# Patient Record
Sex: Female | Born: 1960
Health system: Southern US, Community
[De-identification: ages and names within clinical notes are randomized; demographics above are authoritative.]

## PROBLEM LIST (undated history)

## (undated) DIAGNOSIS — M8580 Other specified disorders of bone density and structure, unspecified site: Secondary | ICD-10-CM

## (undated) DIAGNOSIS — I10 Essential (primary) hypertension: Secondary | ICD-10-CM

## (undated) DIAGNOSIS — Z8742 Personal history of other diseases of the female genital tract: Secondary | ICD-10-CM

## (undated) DIAGNOSIS — L719 Rosacea, unspecified: Secondary | ICD-10-CM

## (undated) DIAGNOSIS — C801 Malignant (primary) neoplasm, unspecified: Secondary | ICD-10-CM

## (undated) DIAGNOSIS — B001 Herpesviral vesicular dermatitis: Secondary | ICD-10-CM

## (undated) DIAGNOSIS — K140 Glossitis: Secondary | ICD-10-CM

## (undated) DIAGNOSIS — D219 Benign neoplasm of connective and other soft tissue, unspecified: Secondary | ICD-10-CM

## (undated) DIAGNOSIS — B009 Herpesviral infection, unspecified: Secondary | ICD-10-CM

## (undated) HISTORY — PX: WISDOM TOOTH EXTRACTION: SHX21

## (undated) HISTORY — DX: Personal history of other diseases of the female genital tract: Z87.42

## (undated) HISTORY — DX: Benign neoplasm of connective and other soft tissue, unspecified: D21.9

## (undated) HISTORY — DX: Essential (primary) hypertension: I10

## (undated) HISTORY — DX: Herpesviral vesicular dermatitis: B00.1

## (undated) HISTORY — DX: Rosacea, unspecified: L71.9

## (undated) HISTORY — DX: Glossitis: K14.0

## (undated) HISTORY — DX: Other specified disorders of bone density and structure, unspecified site: M85.80

## (undated) HISTORY — DX: Herpesviral infection, unspecified: B00.9

---
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
## 1979-10-05 HISTORY — PX: AUGMENTATION MAMMAPLASTY: SUR837

## 1983-10-05 HISTORY — PX: OTHER SURGICAL HISTORY: SHX169

## 2009-10-04 HISTORY — PX: COLONOSCOPY: SHX174

## 2009-10-04 LAB — HM COLONOSCOPY

## 2010-04-23 ENCOUNTER — Ambulatory Visit: Payer: Self-pay | Admitting: Unknown Physician Specialty

## 2010-05-05 ENCOUNTER — Ambulatory Visit: Payer: Self-pay | Admitting: Unknown Physician Specialty

## 2010-12-14 ENCOUNTER — Ambulatory Visit: Payer: Self-pay | Admitting: Unknown Physician Specialty

## 2011-08-20 LAB — HM PAP SMEAR

## 2012-03-29 ENCOUNTER — Ambulatory Visit: Payer: Self-pay

## 2012-10-04 DIAGNOSIS — C801 Malignant (primary) neoplasm, unspecified: Secondary | ICD-10-CM

## 2012-10-04 HISTORY — DX: Malignant (primary) neoplasm, unspecified: C80.1

## 2013-04-03 ENCOUNTER — Ambulatory Visit: Payer: Self-pay

## 2014-05-22 ENCOUNTER — Ambulatory Visit: Payer: Self-pay

## 2014-05-24 LAB — HM MAMMOGRAPHY

## 2015-01-08 ENCOUNTER — Encounter: Payer: Self-pay | Admitting: *Deleted

## 2015-06-23 ENCOUNTER — Other Ambulatory Visit: Payer: Self-pay | Admitting: Certified Nurse Midwife

## 2015-06-23 DIAGNOSIS — Z1231 Encounter for screening mammogram for malignant neoplasm of breast: Secondary | ICD-10-CM

## 2015-06-25 ENCOUNTER — Other Ambulatory Visit: Payer: Self-pay | Admitting: Certified Nurse Midwife

## 2015-06-25 ENCOUNTER — Ambulatory Visit
Admission: RE | Admit: 2015-06-25 | Discharge: 2015-06-25 | Disposition: A | Discharge status: Home / Self Care | Payer: 59 | Source: Ambulatory Visit | Attending: Certified Nurse Midwife | Admitting: Certified Nurse Midwife

## 2015-06-25 DIAGNOSIS — Z1231 Encounter for screening mammogram for malignant neoplasm of breast: Secondary | ICD-10-CM

## 2015-06-25 HISTORY — DX: Malignant (primary) neoplasm, unspecified: C80.1

## 2015-10-09 ENCOUNTER — Other Ambulatory Visit: Payer: Self-pay

## 2015-10-09 ENCOUNTER — Other Ambulatory Visit: Payer: Self-pay | Admitting: Unknown Physician Specialty

## 2015-10-09 DIAGNOSIS — I10 Essential (primary) hypertension: Secondary | ICD-10-CM | POA: Insufficient documentation

## 2015-10-09 DIAGNOSIS — C439 Malignant melanoma of skin, unspecified: Secondary | ICD-10-CM | POA: Insufficient documentation

## 2015-10-09 DIAGNOSIS — B009 Herpesviral infection, unspecified: Secondary | ICD-10-CM | POA: Insufficient documentation

## 2015-10-09 DIAGNOSIS — L719 Rosacea, unspecified: Secondary | ICD-10-CM | POA: Insufficient documentation

## 2015-10-09 DIAGNOSIS — K140 Glossitis: Secondary | ICD-10-CM | POA: Insufficient documentation

## 2015-10-09 NOTE — Telephone Encounter (Signed)
Patient needs refill on her bp medications. Patient schedule an appt for 10/28/14. Her pharmacy is [facility]. Please call pt if refill has been done, thanks.

## 2015-10-09 NOTE — Telephone Encounter (Signed)
Patient was last seen 05/24/14 but scheduled an appointment for 10/29/15. Pharmacy is [facility].

## 2015-10-10 MED ORDER — LISINOPRIL 20 MG PO TABS: 20.0000 mg | ORAL_TABLET | Freq: Every day | ORAL | Status: DC

## 2015-10-10 NOTE — Telephone Encounter (Signed)
Called patient and let her know that her prescription was sent in.

## 2015-10-10 NOTE — Telephone Encounter (Signed)
This was done.

## 2015-10-29 ENCOUNTER — Encounter: Payer: Self-pay | Admitting: Unknown Physician Specialty

## 2015-10-29 ENCOUNTER — Ambulatory Visit (INDEPENDENT_AMBULATORY_CARE_PROVIDER_SITE_OTHER): Payer: 59 | Admitting: Unknown Physician Specialty

## 2015-10-29 VITALS — BP 126/80 | HR 73 | Temp 98.2°F | Ht 64.3 in | Wt 126.2 lb

## 2015-10-29 DIAGNOSIS — B009 Herpesviral infection, unspecified: Secondary | ICD-10-CM | POA: Diagnosis not present

## 2015-10-29 DIAGNOSIS — I10 Essential (primary) hypertension: Secondary | ICD-10-CM | POA: Diagnosis not present

## 2015-10-29 DIAGNOSIS — E7800 Pure hypercholesterolemia, unspecified: Secondary | ICD-10-CM | POA: Diagnosis not present

## 2015-10-29 DIAGNOSIS — L719 Rosacea, unspecified: Secondary | ICD-10-CM | POA: Diagnosis not present

## 2015-10-29 MED ORDER — LISINOPRIL 10 MG PO TABS: 10.0000 mg | ORAL_TABLET | Freq: Every day | ORAL | Status: DC

## 2015-10-29 MED ORDER — TRETINOIN 0.1 % EX CREA: TOPICAL_CREAM | Freq: Every day | CUTANEOUS | Status: AC

## 2015-10-29 MED ORDER — VALACYCLOVIR HCL 500 MG PO TABS: 500.0000 mg | ORAL_TABLET | Freq: Two times a day (BID) | ORAL | Status: DC

## 2015-10-29 MED ORDER — CLINDAMYCIN PHOSPHATE 1 % EX SOLN: 1.0000 "application " | Freq: Two times a day (BID) | CUTANEOUS | Status: DC

## 2015-10-29 NOTE — Progress Notes (Signed)
BP 126/80 mmHg  Pulse 73  Temp(Src) 98.2 F (36.8 C)  Ht 5' 4.3" (1.633 m)  Wt 126 lb 3.2 oz (57.244 kg)  BMI 21.47 kg/m2  SpO2 100%  LMP 12/26/2013 (Approximate)   Subjective:    Patient ID: Hailey Rivera, female    DOB: 07/11/1961, 55 y.o.   MRN: [id number]  HPI: Hailey Rivera is a 55 y.o. female  Chief Complaint  Patient presents with  . Hypertension  Hypertension: Lisinopril. No issues taking medication. Takes BP at work and averages around 110/70. Denies lightheadedness, dizziness. Denies SOB or chest pain. Denies any swelling. Patient states she eats a very healthy diet and exercises 4x a week at the gym doing both cardio and toning classes.   Rosacea: Uses Retin-A as needed for breakouts and is effective.  No issues taking medication.  Herpes: Patient takes Valtrex as needed for outbreaks and likes to have on hand at all times. Medication effective and no issues taking.        Relevant past medical, surgical, family and social history reviewed and updated as indicated. Interim medical history since our last visit reviewed. Allergies and medications reviewed and updated.  Review of Systems  Constitutional: Negative.   HENT: Negative.   Eyes: Negative.   Respiratory: Negative.   Cardiovascular: Negative.   Gastrointestinal: Negative.   Endocrine: Negative.   Genitourinary: Negative.   Musculoskeletal: Negative.   Skin: Negative.   Allergic/Immunologic: Negative.   Neurological: Negative.   Hematological: Negative.   Psychiatric/Behavioral: Negative.     Per HPI unless specifically indicated above     Objective:    BP 126/80 mmHg  Pulse 73  Temp(Src) 98.2 F (36.8 C)  Ht 5' 4.3" (1.633 m)  Wt 126 lb 3.2 oz (57.244 kg)  BMI 21.47 kg/m2  SpO2 100%  LMP 12/26/2013 (Approximate)  Wt Readings from Last 3 Encounters:  10/29/15 126 lb 3.2 oz (57.244 kg)  05/24/14 127 lb (57.607 kg)    Physical Exam  Constitutional: She is oriented to person, place,  and time. She appears well-developed and well-nourished. No distress.  HENT:  Head: Normocephalic and atraumatic.  Eyes: Conjunctivae and lids are normal. Right eye exhibits no discharge. Left eye exhibits no discharge. No scleral icterus.  Neck: Normal range of motion. Neck supple. No JVD present. Carotid bruit is not present.  Cardiovascular: Normal rate, regular rhythm and normal heart sounds.   Pulmonary/Chest: Effort normal and breath sounds normal.  Abdominal: Normal appearance. There is no splenomegaly or hepatomegaly.  Musculoskeletal: Normal range of motion.  Neurological: She is alert and oriented to person, place, and time.  Skin: Skin is warm, dry and intact. No rash noted. No pallor.  Psychiatric: She has a normal mood and affect. Her behavior is normal. Judgment and thought content normal.    Results for orders placed or performed in visit on 10/09/15  HM MAMMOGRAPHY  Result Value Ref Range   HM Mammogram from PP   HM PAP SMEAR  Result Value Ref Range   HM Pap smear from PP   HM COLONOSCOPY  Result Value Ref Range   HM Colonoscopy from PP       Assessment & Plan:   Problem List Items Addressed This Visit      Unprioritized   Hypertension    Stable, continue present medications.       Relevant Medications   lisinopril (PRINIVIL,ZESTRIL) 10 MG tablet   Herpes simplex - Primary    Stable,  continue present medications.       Relevant Medications   valACYclovir (VALTREX) 500 MG tablet   Rosacea    Stable, continue present medications.       Hypercholesteremia    LDL slightly elevated at 107.  ASCVD calculator shows not in statin benefit group at this time. Encouraged to increase her exercising to 5 days a week as well as trying HIIT training.        Relevant Medications   lisinopril (PRINIVIL,ZESTRIL) 10 MG tablet       Follow up plan: Return in about 6 months (around 04/27/2016).

## 2015-10-29 NOTE — Assessment & Plan Note (Signed)
Stable, continue present medications.   

## 2015-10-29 NOTE — Assessment & Plan Note (Addendum)
Stable, continue present medications.   

## 2015-10-29 NOTE — Assessment & Plan Note (Signed)
LDL slightly elevated at 107.  ASCVD calculator shows not in statin benefit group at this time. Encouraged to increase her exercising to 5 days a week as well as trying HIIT training.

## 2015-12-19 DIAGNOSIS — L814 Other melanin hyperpigmentation: Secondary | ICD-10-CM | POA: Diagnosis not present

## 2015-12-19 DIAGNOSIS — D225 Melanocytic nevi of trunk: Secondary | ICD-10-CM | POA: Diagnosis not present

## 2015-12-19 DIAGNOSIS — L853 Xerosis cutis: Secondary | ICD-10-CM | POA: Diagnosis not present

## 2015-12-19 DIAGNOSIS — D229 Melanocytic nevi, unspecified: Secondary | ICD-10-CM | POA: Diagnosis not present

## 2015-12-19 DIAGNOSIS — Z85820 Personal history of malignant melanoma of skin: Secondary | ICD-10-CM | POA: Diagnosis not present

## 2016-03-02 ENCOUNTER — Encounter: Payer: Self-pay | Admitting: Physician Assistant

## 2016-03-02 ENCOUNTER — Ambulatory Visit: Payer: Self-pay | Admitting: Physician Assistant

## 2016-03-02 VITALS — BP 118/82 | HR 60 | Temp 98.5°F

## 2016-03-02 DIAGNOSIS — R300 Dysuria: Secondary | ICD-10-CM

## 2016-03-02 DIAGNOSIS — N390 Urinary tract infection, site not specified: Secondary | ICD-10-CM

## 2016-03-02 LAB — POCT URINALYSIS DIPSTICK
Bilirubin, UA: NEGATIVE
Blood, UA: NEGATIVE
Glucose, UA: NEGATIVE
Ketones, UA: NEGATIVE
Nitrite, UA: NEGATIVE
Protein, UA: NEGATIVE
Spec Grav, UA: 1.02
Urobilinogen, UA: 0.2
pH, UA: 6

## 2016-03-02 MED ORDER — CIPROFLOXACIN HCL 500 MG PO TABS: 500.0000 mg | ORAL_TABLET | Freq: Two times a day (BID) | ORAL | Status: DC

## 2016-03-02 NOTE — Progress Notes (Signed)
S:  C/o uti sx for 2 days, burning, urgency, frequency, denies vaginal discharge, abdominal pain or flank pain:  Remainder ros neg  O:  Vitals wnl, nad, no cva tenderness, back nontender, lungs c t a,cv rrr, abd soft nontender, bs normal, n/v intact  A: uti  P: cipro 500 mg bid x 3d, increase water intake, add cranberry juice, return if not improving in 2 -3 days, return earlier if worsening, discussed pyelonephritis sx

## 2016-03-31 DIAGNOSIS — H5213 Myopia, bilateral: Secondary | ICD-10-CM | POA: Diagnosis not present

## 2016-06-25 DIAGNOSIS — D225 Melanocytic nevi of trunk: Secondary | ICD-10-CM | POA: Diagnosis not present

## 2016-06-25 DIAGNOSIS — L814 Other melanin hyperpigmentation: Secondary | ICD-10-CM | POA: Diagnosis not present

## 2016-06-25 DIAGNOSIS — D2362 Other benign neoplasm of skin of left upper limb, including shoulder: Secondary | ICD-10-CM | POA: Diagnosis not present

## 2016-06-25 DIAGNOSIS — D1801 Hemangioma of skin and subcutaneous tissue: Secondary | ICD-10-CM | POA: Diagnosis not present

## 2016-06-25 DIAGNOSIS — D485 Neoplasm of uncertain behavior of skin: Secondary | ICD-10-CM | POA: Diagnosis not present

## 2016-06-25 DIAGNOSIS — L821 Other seborrheic keratosis: Secondary | ICD-10-CM | POA: Diagnosis not present

## 2016-06-25 DIAGNOSIS — L570 Actinic keratosis: Secondary | ICD-10-CM | POA: Diagnosis not present

## 2016-06-25 DIAGNOSIS — L988 Other specified disorders of the skin and subcutaneous tissue: Secondary | ICD-10-CM | POA: Diagnosis not present

## 2016-06-25 DIAGNOSIS — Z85820 Personal history of malignant melanoma of skin: Secondary | ICD-10-CM | POA: Diagnosis not present

## 2016-07-05 ENCOUNTER — Encounter: Payer: Self-pay | Admitting: Physician Assistant

## 2016-07-05 ENCOUNTER — Ambulatory Visit: Payer: Self-pay | Admitting: Physician Assistant

## 2016-07-05 VITALS — BP 120/80 | HR 80 | Temp 98.8°F

## 2016-07-05 DIAGNOSIS — R300 Dysuria: Secondary | ICD-10-CM

## 2016-07-05 DIAGNOSIS — N390 Urinary tract infection, site not specified: Secondary | ICD-10-CM

## 2016-07-05 LAB — POCT URINALYSIS DIPSTICK
BILIRUBIN UA: NEGATIVE
Glucose, UA: NEGATIVE
KETONES UA: NEGATIVE
Nitrite, UA: NEGATIVE
Protein, UA: NEGATIVE
RBC UA: NEGATIVE
SPEC GRAV UA: 1.02
Urobilinogen, UA: 0.2
pH, UA: 6

## 2016-07-05 MED ORDER — CIPROFLOXACIN HCL 250 MG PO TABS: 250.0000 mg | ORAL_TABLET | Freq: Two times a day (BID) | ORAL | 0 refills | Status: DC

## 2016-07-05 MED ORDER — PHENAZOPYRIDINE HCL 200 MG PO TABS: 200.0000 mg | ORAL_TABLET | Freq: Three times a day (TID) | ORAL | 0 refills | Status: DC | PRN

## 2016-07-05 NOTE — Progress Notes (Signed)
S:  C/o uti sx for 3 days, burning, urgency, frequency, denies vaginal discharge, abdominal pain or flank pain:  Remainder ros neg; took some of her mother's macrobid  O:  Vitals wnl, nad, no cva tenderness, back nontender, ua 1+ leuks  A: uti  P: cipro 250mg  bid x 7d, pyridium 200mg , increase water intake, add cranberry juice, return if not improving in 2 -3 days, return earlier if worsening, discussed pyelonephritis sx

## 2016-07-07 ENCOUNTER — Other Ambulatory Visit: Payer: Self-pay | Admitting: Certified Nurse Midwife

## 2016-07-16 DIAGNOSIS — D485 Neoplasm of uncertain behavior of skin: Secondary | ICD-10-CM | POA: Diagnosis not present

## 2016-07-16 DIAGNOSIS — L988 Other specified disorders of the skin and subcutaneous tissue: Secondary | ICD-10-CM | POA: Diagnosis not present

## 2016-07-16 DIAGNOSIS — D492 Neoplasm of unspecified behavior of bone, soft tissue, and skin: Secondary | ICD-10-CM | POA: Diagnosis not present

## 2016-07-28 DIAGNOSIS — Z124 Encounter for screening for malignant neoplasm of cervix: Secondary | ICD-10-CM | POA: Diagnosis not present

## 2016-07-28 DIAGNOSIS — Z01419 Encounter for gynecological examination (general) (routine) without abnormal findings: Secondary | ICD-10-CM | POA: Diagnosis not present

## 2016-07-30 ENCOUNTER — Other Ambulatory Visit: Payer: Self-pay | Admitting: Certified Nurse Midwife

## 2016-07-30 DIAGNOSIS — Z13820 Encounter for screening for osteoporosis: Secondary | ICD-10-CM

## 2016-07-30 DIAGNOSIS — Z8262 Family history of osteoporosis: Secondary | ICD-10-CM

## 2016-08-03 ENCOUNTER — Ambulatory Visit
Admission: RE | Admit: 2016-08-03 | Discharge: 2016-08-03 | Disposition: A | Discharge status: Home / Self Care | Payer: 59 | Source: Ambulatory Visit | Attending: Certified Nurse Midwife | Admitting: Certified Nurse Midwife

## 2016-08-03 DIAGNOSIS — M810 Age-related osteoporosis without current pathological fracture: Secondary | ICD-10-CM | POA: Insufficient documentation

## 2016-08-03 DIAGNOSIS — Z8262 Family history of osteoporosis: Secondary | ICD-10-CM | POA: Insufficient documentation

## 2016-08-03 DIAGNOSIS — Z13820 Encounter for screening for osteoporosis: Secondary | ICD-10-CM | POA: Insufficient documentation

## 2016-08-03 DIAGNOSIS — M818 Other osteoporosis without current pathological fracture: Secondary | ICD-10-CM | POA: Diagnosis not present

## 2016-08-09 ENCOUNTER — Other Ambulatory Visit: Payer: Self-pay | Admitting: Certified Nurse Midwife

## 2016-08-09 DIAGNOSIS — Z1231 Encounter for screening mammogram for malignant neoplasm of breast: Secondary | ICD-10-CM

## 2016-09-10 ENCOUNTER — Ambulatory Visit
Admission: RE | Admit: 2016-09-10 | Discharge: 2016-09-10 | Disposition: A | Discharge status: Home / Self Care | Payer: 59 | Source: Ambulatory Visit | Attending: Certified Nurse Midwife | Admitting: Certified Nurse Midwife

## 2016-09-10 DIAGNOSIS — Z1231 Encounter for screening mammogram for malignant neoplasm of breast: Secondary | ICD-10-CM | POA: Diagnosis not present

## 2016-11-17 ENCOUNTER — Other Ambulatory Visit: Payer: Self-pay | Admitting: Unknown Physician Specialty

## 2016-12-24 DIAGNOSIS — D2362 Other benign neoplasm of skin of left upper limb, including shoulder: Secondary | ICD-10-CM | POA: Diagnosis not present

## 2016-12-24 DIAGNOSIS — Z85820 Personal history of malignant melanoma of skin: Secondary | ICD-10-CM | POA: Diagnosis not present

## 2016-12-24 DIAGNOSIS — L281 Prurigo nodularis: Secondary | ICD-10-CM | POA: Diagnosis not present

## 2016-12-24 DIAGNOSIS — D229 Melanocytic nevi, unspecified: Secondary | ICD-10-CM | POA: Diagnosis not present

## 2017-01-04 DIAGNOSIS — Z0120 Encounter for dental examination and cleaning without abnormal findings: Secondary | ICD-10-CM | POA: Diagnosis not present

## 2017-06-01 DIAGNOSIS — L578 Other skin changes due to chronic exposure to nonionizing radiation: Secondary | ICD-10-CM | POA: Diagnosis not present

## 2017-06-01 DIAGNOSIS — L814 Other melanin hyperpigmentation: Secondary | ICD-10-CM | POA: Diagnosis not present

## 2017-06-01 DIAGNOSIS — Z85820 Personal history of malignant melanoma of skin: Secondary | ICD-10-CM | POA: Diagnosis not present

## 2017-06-01 DIAGNOSIS — D485 Neoplasm of uncertain behavior of skin: Secondary | ICD-10-CM | POA: Diagnosis not present

## 2017-06-22 DIAGNOSIS — D229 Melanocytic nevi, unspecified: Secondary | ICD-10-CM | POA: Diagnosis not present

## 2017-06-22 DIAGNOSIS — D485 Neoplasm of uncertain behavior of skin: Secondary | ICD-10-CM | POA: Diagnosis not present

## 2017-08-10 ENCOUNTER — Encounter: Payer: Self-pay | Admitting: Certified Nurse Midwife

## 2017-08-10 ENCOUNTER — Ambulatory Visit (INDEPENDENT_AMBULATORY_CARE_PROVIDER_SITE_OTHER): Payer: 59 | Admitting: Certified Nurse Midwife

## 2017-08-10 VITALS — BP 100/60 | HR 62 | Ht 64.5 in | Wt 126.0 lb

## 2017-08-10 DIAGNOSIS — Z1231 Encounter for screening mammogram for malignant neoplasm of breast: Secondary | ICD-10-CM | POA: Diagnosis not present

## 2017-08-10 DIAGNOSIS — Z01419 Encounter for gynecological examination (general) (routine) without abnormal findings: Secondary | ICD-10-CM | POA: Diagnosis not present

## 2017-08-10 DIAGNOSIS — M85851 Other specified disorders of bone density and structure, right thigh: Secondary | ICD-10-CM

## 2017-08-10 DIAGNOSIS — Z1321 Encounter for screening for nutritional disorder: Secondary | ICD-10-CM | POA: Diagnosis not present

## 2017-08-10 DIAGNOSIS — Z131 Encounter for screening for diabetes mellitus: Secondary | ICD-10-CM

## 2017-08-10 DIAGNOSIS — Z13220 Encounter for screening for lipoid disorders: Secondary | ICD-10-CM

## 2017-08-10 DIAGNOSIS — I10 Essential (primary) hypertension: Secondary | ICD-10-CM

## 2017-08-10 DIAGNOSIS — Z124 Encounter for screening for malignant neoplasm of cervix: Secondary | ICD-10-CM

## 2017-08-10 DIAGNOSIS — M818 Other osteoporosis without current pathological fracture: Secondary | ICD-10-CM

## 2017-08-10 DIAGNOSIS — M810 Age-related osteoporosis without current pathological fracture: Secondary | ICD-10-CM

## 2017-08-10 DIAGNOSIS — Z1239 Encounter for other screening for malignant neoplasm of breast: Secondary | ICD-10-CM

## 2017-08-10 MED ORDER — VALACYCLOVIR HCL 1 G PO TABS: ORAL_TABLET | ORAL | 5 refills | Status: DC

## 2017-08-10 NOTE — Progress Notes (Signed)
Gynecology Annual Exam  PCP: Kathrine Haddock, NP  Chief Complaint:  Chief Complaint  Patient presents with  . Gynecologic Exam    History of Present Illness:Hailey Rivera presents today for her annual exam. She is a 56 year old Caucasian/White female , G 2 P 2 0 0 2 , whose LMP was 12/2013. She is having problems with vaginal dryness during intercourse. Does use lubricants. Tried estrogen vaginal cream, but stopped using as was not having frequent IC. Her menses are absent and she is postmenopausal. She does also have occasional hot flashes and night sweats.  She has not had any spotting.   The patient's past medical history is notable for a history of melanoma, hypertension, fibroids, and a remote hx of cervical dysplasia treated with a CKC. She has also had a bilateral breast augmentation..  Since her last annual GYN exam dated 07/28/2016, she has had a DEXA scan which revealed osteopenia of the femur and osteoporosis of the spine. Frax scores were 8.1% and 1.3%. She also had another atypical lesion removed from her inner left thigh   Her most recent pap smear was obtained 07/28/2016 and was normal.  Her most recent mammogram obtained on 09/20/2016  was negative. There was no mention of some prominent folds of the implants and a question of implant rupture as there was on a previous mamogram. There is no family history of breast cancer. There is a family history of ovarian cancer in her maternal great grandmother. Genetic testing has not been done. The patient does do monthly self breast exams.  She had a colonoscopy in 2011 that showed hemorrhoids. Her next colonoscopy is due in 10 years.  She had a recent DEXA scan. See notation above The patient does not smoke.  The patient does drink occasionally.  The patient does not use illegal drugs.  The patient exercises regularly. Goes to the gym 4x/week  The patient does not get adequate calcium in her diet and she takes a calcium  supplement. She also takes a vitamin D3 supplement She has not had a recent cholesterol screen and would like screening labs.   The patient denies current symptoms of depression.    Review of Systems: Review of Systems  Constitutional: Negative for chills, fever and weight loss.  HENT: Negative for congestion, sinus pain and sore throat.   Eyes: Negative for blurred vision and pain.  Respiratory: Negative for hemoptysis, shortness of breath and wheezing.   Cardiovascular: Negative for chest pain, palpitations and leg swelling.  Gastrointestinal: Negative for abdominal pain, blood in stool, diarrhea, heartburn, nausea and vomiting.  Genitourinary: Negative for dysuria, frequency, hematuria and urgency.  Musculoskeletal: Negative for back pain, joint pain and myalgias.  Skin: Negative for itching and rash.  Neurological: Negative for dizziness, tingling and headaches.  Endo/Heme/Allergies: Negative for environmental allergies and polydipsia. Does not bruise/bleed easily.       Negative for hirsutism   Psychiatric/Behavioral: Negative for depression. The patient is not nervous/anxious and does not have insomnia.     Past Medical History:  Past Medical History:  Diagnosis Date  . Cancer ([place]) 10/04/2012   melanoma  . Fibroids   . Glossitis   . Herpes simplex   . History of abnormal cervical Pap smear    cone biopsy 1985  . Hypertension   . Osteopenia   . Rosacea     Past Surgical History:  Past Surgical History:  Procedure Laterality Date  . AUGMENTATION MAMMAPLASTY Bilateral  [place]    . COLONOSCOPY  2011   internal hemorrhoids  . TONSILLECTOMY  1971  . WISDOM TOOTH EXTRACTION     age 63    Family History:  Family History  Problem Relation Age of Onset  . Multiple myeloma Father 43  . Heart disease Father   . Hypertension Father   . Cancer Father   . Hypertension Mother   . Hyperlipidemia Mother   . Osteoporosis Mother   . Hypertension Brother     . Diabetes Maternal Grandmother   . Alcohol abuse Maternal Grandfather   . Hypertension Paternal Grandmother   . Heart disease Paternal Grandmother   . Heart disease Paternal Grandfather        MI  . Stroke Paternal Grandfather   . GI problems Brother   . Diabetes Maternal Aunt   . Ovarian cancer Other 65  . Uterine cancer Other   . Pancreatic cancer Other 33    Social History:  Social History   Socioeconomic History  . Marital status: Married    Spouse name: Not on file  . Number of children: Not on file  . Years of education: Not on file  . Highest education level: Not on file  Social Needs  . Financial resource strain: Not on file  . Food insecurity - worry: Not on file  . Food insecurity - inability: Not on file  . Transportation needs - medical: Not on file  . Transportation needs - non-medical: Not on file  Occupational History  . Not on file  Tobacco Use  . Smoking status: Never Smoker  . Smokeless tobacco: Never Used  Substance and Sexual Activity  . Alcohol use: Yes    Alcohol/week: 0.0 oz    Comment: on occasion  . Drug use: No  . Sexual activity: Yes    Birth control/protection: Post-menopausal  Other Topics Concern  . Not on file  Social History Narrative  . Not on file    Allergies:  No Known Allergies  Medications:  Current Outpatient Medications on File Prior to Visit  Medication Sig Dispense Refill  . lisinopril (PRINIVIL,ZESTRIL) 10 MG tablet TAKE 1 TABLET BY MOUTH DAILY (Patient taking differently: TAKE 1/2 TABLET BY MOUTH DAILY) 90 tablet 12  . tretinoin (RETIN-A) 0.1 % cream Apply topically at bedtime. 45 g 3   No current facility-administered medications on file prior to visit.   Vitamin D3 5000 IU several times a week Calcium 500-600 gm daily   Physical Exam Vitals:BP 100/60   Pulse 62   Ht 5' 4.5" (1.638 m)   Wt 126 lb (57.2 kg)   LMP 12/26/2013 (Approximate)   BMI 21.29 kg/m   General:WF in NAD HEENT: normocephalic,  anicteric Neck: no thyroid enlargement, no palpable nodules, no cervical lymphadenopathy  Pulmonary: No increased work of breathing, CTAB Cardiovascular: RRR, without murmur  Breast: Breast symmetrical, no tenderness, implants present, no palpable nodules or masses, no skin or nipple retraction present, no nipple discharge.  No axillary, infraclavicular or supraclavicular lymphadenopathy. Abdomen: Soft, non-tender, non-distended.  Umbilicus without lesions.  No hepatomegaly or masses palpable. No evidence of hernia. Genitourinary:  External: Atrophic changes. Small red abrasion to the right of the vaginal openin  Normal urethral meatus, normal Bartholin's and Skene's glands.    Vagina: Flattened rugae, no evidence of prolapse.    Cervix: Grossly normal in appearance, no bleeding, non-tender  Uterus: Anteverted, irregularity on the right fundal area, mobile, and non-tender  Adnexa:  No adnexal masses, non-tender  Rectal: deferred  Lymphatic: no evidence of inguinal lymphadenopathy Extremities: no edema, erythema, or tenderness Neurologic: Grossly intact Psychiatric: mood appropriate, affect full     Assessment: 56 y.o. annual gyn exam Osteoporosis of spine and osteopenia of femur  Plan:    1) Breast cancer screening - recommend monthly self breast exam and annual screening mammograms. Mammogram was ordered today. Patient to call for appointment at [facility]  2) DIscussed calcium and vitamin D3 requirements as well as exercise to combat osteoporosis.Repeat Dexa scan next year  3) Cervical cancer screening - Pap was done.   4) Colon cancer screening UTD-due 2021  5) Routine healthcare maintenance including cholesterol and diabetes screening ordered today   6) RTO in 1 year and prn  Dalia Heading, CNM

## 2017-08-11 LAB — COMPREHENSIVE METABOLIC PANEL
A/G RATIO: 2 (ref 1.2–2.2)
ALK PHOS: 114 IU/L (ref 39–117)
ALT: 15 IU/L (ref 0–32)
AST: 23 IU/L (ref 0–40)
Albumin: 4.4 g/dL (ref 3.5–5.5)
BUN/Creatinine Ratio: 16 (ref 9–23)
BUN: 11 mg/dL (ref 6–24)
Bilirubin Total: 0.3 mg/dL (ref 0.0–1.2)
CALCIUM: 10 mg/dL (ref 8.7–10.2)
CHLORIDE: 104 mmol/L (ref 96–106)
CO2: 26 mmol/L (ref 20–29)
Creatinine, Ser: 0.7 mg/dL (ref 0.57–1.00)
GFR calc Af Amer: 112 mL/min/{1.73_m2} (ref >59)
GFR calc non Af Amer: 97 mL/min/{1.73_m2} (ref >59)
GLOBULIN, TOTAL: 2.2 g/dL (ref 1.5–4.5)
Glucose: 81 mg/dL (ref 65–99)
POTASSIUM: 4.5 mmol/L (ref 3.5–5.2)
SODIUM: 142 mmol/L (ref 134–144)
Total Protein: 6.6 g/dL (ref 6.0–8.5)

## 2017-08-11 LAB — LIPID PANEL WITH LDL/HDL RATIO
CHOLESTEROL TOTAL: 193 mg/dL (ref 100–199)
HDL: 67 mg/dL (ref >39)
LDL CALC: 115 mg/dL — AB (ref 0–99)
LDl/HDL Ratio: 1.7 ratio (ref 0.0–3.2)
TRIGLYCERIDES: 55 mg/dL (ref 0–149)
VLDL CHOLESTEROL CAL: 11 mg/dL (ref 5–40)

## 2017-08-11 LAB — VITAMIN D 25 HYDROXY (VIT D DEFICIENCY, FRACTURES): Vit D, 25-Hydroxy: 59.3 ng/mL (ref 30.0–100.0)

## 2017-08-11 LAB — HGB A1C W/O EAG: HEMOGLOBIN A1C: 5.4 % (ref 4.8–5.6)

## 2017-08-12 LAB — IGP, APTIMA HPV
HPV APTIMA: NEGATIVE
PAP Smear Comment: 0

## 2017-08-19 ENCOUNTER — Encounter: Payer: Self-pay | Admitting: Certified Nurse Midwife

## 2017-08-27 ENCOUNTER — Encounter: Payer: Self-pay | Admitting: Certified Nurse Midwife

## 2017-08-27 DIAGNOSIS — M810 Age-related osteoporosis without current pathological fracture: Secondary | ICD-10-CM | POA: Insufficient documentation

## 2017-08-27 DIAGNOSIS — M818 Other osteoporosis without current pathological fracture: Secondary | ICD-10-CM

## 2017-08-27 DIAGNOSIS — M85859 Other specified disorders of bone density and structure, unspecified thigh: Secondary | ICD-10-CM | POA: Insufficient documentation

## 2017-09-22 ENCOUNTER — Ambulatory Visit
Admission: RE | Admit: 2017-09-22 | Discharge: 2017-09-22 | Disposition: A | Discharge status: Home / Self Care | Payer: 59 | Source: Ambulatory Visit | Attending: Certified Nurse Midwife | Admitting: Certified Nurse Midwife

## 2017-09-22 DIAGNOSIS — Z1239 Encounter for other screening for malignant neoplasm of breast: Secondary | ICD-10-CM

## 2017-09-22 DIAGNOSIS — Z1231 Encounter for screening mammogram for malignant neoplasm of breast: Secondary | ICD-10-CM | POA: Insufficient documentation

## 2017-11-24 ENCOUNTER — Other Ambulatory Visit: Payer: Self-pay | Admitting: Unknown Physician Specialty

## 2017-11-29 ENCOUNTER — Encounter (INDEPENDENT_AMBULATORY_CARE_PROVIDER_SITE_OTHER): Payer: Self-pay

## 2017-11-29 ENCOUNTER — Other Ambulatory Visit: Payer: Self-pay | Admitting: Certified Nurse Midwife

## 2017-11-29 MED ORDER — LISINOPRIL 10 MG PO TABS: 5.0000 mg | ORAL_TABLET | Freq: Every day | ORAL | 0 refills | Status: DC

## 2017-11-29 NOTE — Telephone Encounter (Signed)
Pt called nurse line, only gave DOB and # to call [phone].

## 2017-12-01 DIAGNOSIS — L578 Other skin changes due to chronic exposure to nonionizing radiation: Secondary | ICD-10-CM | POA: Diagnosis not present

## 2017-12-01 DIAGNOSIS — D1800 Hemangioma unspecified site: Secondary | ICD-10-CM | POA: Diagnosis not present

## 2017-12-01 DIAGNOSIS — L720 Epidermal cyst: Secondary | ICD-10-CM | POA: Diagnosis not present

## 2017-12-01 DIAGNOSIS — Z86018 Personal history of other benign neoplasm: Secondary | ICD-10-CM | POA: Diagnosis not present

## 2017-12-01 DIAGNOSIS — L281 Prurigo nodularis: Secondary | ICD-10-CM | POA: Diagnosis not present

## 2017-12-01 DIAGNOSIS — L0109 Other impetigo: Secondary | ICD-10-CM | POA: Diagnosis not present

## 2017-12-01 DIAGNOSIS — D485 Neoplasm of uncertain behavior of skin: Secondary | ICD-10-CM | POA: Diagnosis not present

## 2018-05-23 DIAGNOSIS — H5213 Myopia, bilateral: Secondary | ICD-10-CM | POA: Diagnosis not present

## 2018-06-23 DIAGNOSIS — L281 Prurigo nodularis: Secondary | ICD-10-CM | POA: Diagnosis not present

## 2018-06-23 DIAGNOSIS — L821 Other seborrheic keratosis: Secondary | ICD-10-CM | POA: Diagnosis not present

## 2018-06-23 DIAGNOSIS — Z85820 Personal history of malignant melanoma of skin: Secondary | ICD-10-CM | POA: Diagnosis not present

## 2018-06-23 DIAGNOSIS — Z859 Personal history of malignant neoplasm, unspecified: Secondary | ICD-10-CM | POA: Diagnosis not present

## 2018-06-23 DIAGNOSIS — Z86018 Personal history of other benign neoplasm: Secondary | ICD-10-CM | POA: Diagnosis not present

## 2018-06-23 DIAGNOSIS — Z1283 Encounter for screening for malignant neoplasm of skin: Secondary | ICD-10-CM | POA: Diagnosis not present

## 2018-06-23 DIAGNOSIS — L578 Other skin changes due to chronic exposure to nonionizing radiation: Secondary | ICD-10-CM | POA: Diagnosis not present

## 2018-09-06 ENCOUNTER — Ambulatory Visit: Payer: 59 | Admitting: Certified Nurse Midwife

## 2018-09-18 ENCOUNTER — Other Ambulatory Visit (HOSPITAL_COMMUNITY)
Admission: RE | Admit: 2018-09-18 | Discharge: 2018-09-18 | Disposition: A | Discharge status: Home / Self Care | Payer: 59 | Source: Ambulatory Visit | Attending: Certified Nurse Midwife | Admitting: Certified Nurse Midwife

## 2018-09-18 ENCOUNTER — Ambulatory Visit (INDEPENDENT_AMBULATORY_CARE_PROVIDER_SITE_OTHER): Payer: 59 | Admitting: Certified Nurse Midwife

## 2018-09-18 ENCOUNTER — Encounter: Payer: Self-pay | Admitting: Certified Nurse Midwife

## 2018-09-18 ENCOUNTER — Ambulatory Visit
Admission: RE | Admit: 2018-09-18 | Discharge: 2018-09-18 | Disposition: A | Discharge status: Home / Self Care | Payer: 59 | Source: Ambulatory Visit | Attending: Certified Nurse Midwife | Admitting: Certified Nurse Midwife

## 2018-09-18 VITALS — BP 112/60 | HR 72 | Ht 64.5 in | Wt 123.0 lb

## 2018-09-18 DIAGNOSIS — Z01419 Encounter for gynecological examination (general) (routine) without abnormal findings: Secondary | ICD-10-CM | POA: Diagnosis not present

## 2018-09-18 DIAGNOSIS — Z1239 Encounter for other screening for malignant neoplasm of breast: Secondary | ICD-10-CM

## 2018-09-18 DIAGNOSIS — Z124 Encounter for screening for malignant neoplasm of cervix: Secondary | ICD-10-CM | POA: Diagnosis not present

## 2018-09-18 DIAGNOSIS — Z780 Asymptomatic menopausal state: Secondary | ICD-10-CM | POA: Diagnosis not present

## 2018-09-18 DIAGNOSIS — M85851 Other specified disorders of bone density and structure, right thigh: Secondary | ICD-10-CM | POA: Insufficient documentation

## 2018-09-18 DIAGNOSIS — M818 Other osteoporosis without current pathological fracture: Secondary | ICD-10-CM | POA: Insufficient documentation

## 2018-09-18 DIAGNOSIS — M810 Age-related osteoporosis without current pathological fracture: Secondary | ICD-10-CM

## 2018-09-18 MED ORDER — VALACYCLOVIR HCL 1 G PO TABS: ORAL_TABLET | ORAL | 5 refills | Status: DC

## 2018-09-18 NOTE — Progress Notes (Signed)
Gynecology Annual Exam  PCP: Kathrine Haddock, NP  Chief Complaint:  Chief Complaint  Patient presents with  . Gynecologic Exam    Refill on Valtrex    History of Present Illness:Hailey Rivera presents today for her annual exam and for a refill of Valtrex for cold sores. She is a 57 year old Caucasian/White female , G 2 P 2 0 0 2 , whose LMP was 12/2013. She is having problems with vaginal dryness during intercourse. Does use lubricants. Tried estrogen vaginal cream, but stopped using as was not having frequent IC. Her menses are absent and she is postmenopausal. She denies hot flashes. She has not had any spotting.   The patient's past medical history is notable for a history of melanoma, hypertension, fibroids, osteoporosis of the spine and osteopenia of the femur, and a remote hx of cervical dysplasia treated with a CKC. She has also had a bilateral breast augmentation. Since her last annual GYN exam dated 08/10/2017, she has not had any significant changes in her health. Her dermatologist is now seeing her every year for follow up on her melanoma since she has had no recurrence.   A DEXA scan on 08/03/2016 revealed osteopenia of the femur (-2.2 T score) and osteoporosis (-2.6 T score) of the spine. Frax scores were 8.1% and 1.3%.   Her most recent pap smear was obtained 08/10/2017 and was NIL/neg HRHPV.  Her most recent mammogram obtained on 09/22/2017  was negative. There is no family history of breast cancer. There is a family history of ovarian cancer in her maternal great grandmother. Genetic testing has not been done. The patient does do monthly self breast exams.  She had a colonoscopy in 2011 that showed hemorrhoids. Her next colonoscopy is due in 10 years.   The patient does not smoke.  The patient does drink occasionally.  The patient does not use illegal drugs.  The patient exercises regularly. Goes to the gym 4x/week  The patient does not get adequate calcium in her  diet and she takes a calcium supplement. She also takes a vitamin D3 supplement She has had a recent cholesterol and diabetes screen in 2018 and they were normal. Vitamin D3 level was also normal.   The patient denies current symptoms of depression.    Review of Systems: Review of Systems  Constitutional: Negative for chills, fever and weight loss.  HENT: Negative for congestion, sinus pain and sore throat.   Eyes: Negative for blurred vision and pain.  Respiratory: Negative for hemoptysis, shortness of breath and wheezing.   Cardiovascular: Negative for chest pain, palpitations and leg swelling.  Gastrointestinal: Negative for abdominal pain, blood in stool, diarrhea, heartburn, nausea and vomiting.  Genitourinary: Negative for dysuria, frequency, hematuria and urgency.       Positive for vaginal dryness  Musculoskeletal: Negative for back pain, joint pain and myalgias.  Skin: Negative for itching and rash.  Neurological: Negative for dizziness, tingling and headaches.  Endo/Heme/Allergies: Positive for environmental allergies. Negative for polydipsia. Does not bruise/bleed easily.       Negative for hirsutism   Psychiatric/Behavioral: Negative for depression. The patient is not nervous/anxious and does not have insomnia.     Past Medical History:  Past Medical History:  Diagnosis Date  . Cancer ([place]) 10/04/2012   melanoma  . Fibroids   . Glossitis   . Herpes simplex   . History of abnormal cervical Pap smear    cone biopsy 1985  .  Hypertension   . Osteopenia   . Rosacea     Past Surgical History:  Past Surgical History:  Procedure Laterality Date  . AUGMENTATION MAMMAPLASTY Bilateral 1981  . [place]  . CKC  1985   CKC of cervix  . COLONOSCOPY  2011   internal hemorrhoids  . TONSILLECTOMY  1971  . WISDOM TOOTH EXTRACTION  age 89   age 17    Family History:  Family History  Problem Relation Age of Onset  . Multiple myeloma Father 63  . Heart  disease Father   . Hypertension Father   . Cancer Father   . Hypertension Mother   . Hyperlipidemia Mother   . Osteoporosis Mother   . Hypertension Brother   . Diabetes Maternal Grandmother   . Alcohol abuse Maternal Grandfather   . Hypertension Paternal Grandmother   . Heart disease Paternal Grandmother   . Heart disease Paternal Grandfather        MI  . GI problems Brother   . Diabetes Maternal Aunt   . Ovarian cancer Other 60       ovarian vs uterine cancer  . Diabetes Other   . Pancreatic cancer Other 60  . Heart disease Paternal Uncle     Social History:  Social History   Socioeconomic History  . Marital status: Married    Spouse name: Not on file  . Number of children: 2  . Years of education: 16  . Highest education level: Bachelor's degree (e.g., BA, AB, BS)  Occupational History  . Occupation: Therapist, sports  Social Needs  . Financial resource strain: Not on file  . Food insecurity:    Worry: Not on file    Inability: Not on file  . Transportation needs:    Medical: Not on file    Non-medical: Not on file  Tobacco Use  . Smoking status: Never Smoker  . Smokeless tobacco: Never Used  Substance and Sexual Activity  . Alcohol use: Yes    Alcohol/week: 0.0 standard drinks    Comment: on occasion  . Drug use: No  . Sexual activity: Yes    Partners: Male    Birth control/protection: Post-menopausal  Lifestyle  . Physical activity:    Days per week: 4 days    Minutes per session: 60 min  . Stress: Not at all  Relationships  . Social connections:    Talks on phone: More than three times a week    Gets together: Twice a week    Attends religious service: More than 4 times per year    Active member of club or organization: Yes    Attends meetings of clubs or organizations: More than 4 times per year    Relationship status: Married  . Intimate partner violence:    Fear of current or ex partner: No    Emotionally abused: No    Physically abused: No    Forced  sexual activity: No  Other Topics Concern  . Not on file  Social History Narrative  . Not on file    Allergies:  No Known Allergies  Medications:  Current Outpatient Medications on File Prior to Visit  Medication Sig Dispense Refill  . tretinoin (RETIN-A) 0.1 % cream Apply topically at bedtime. 45 g 3  . valACYclovir (VALTREX) 1000 MG tablet Take 2000 mgm q12hours x2 doses prn cold sores 30 tablet 5   No current facility-administered medications on file prior to visit.   Vitamin D3 5000  IU several times a week Calcium 500-600 gm daily   Physical Exam Vitals:BP 112/60 (BP Location: Left Arm, Patient Position: Sitting, Cuff Size: Normal)   Pulse 72   Ht 5' 4.5" (1.638 m)   Wt 123 lb (55.8 kg)   LMP 12/26/2013 (Approximate)   BMI 20.79 kg/m   General:WF in NAD HEENT: normocephalic, anicteric. Has cold sores on upper lip Neck: no thyroid enlargement, no palpable nodules, no cervical lymphadenopathy  Pulmonary: No increased work of breathing, CTAB Cardiovascular: RRR, without murmur  Breast: Breast symmetrical, no tenderness, implants present, no palpable nodules or masses, no skin or nipple retraction present, no nipple discharge.  No axillary, infraclavicular or supraclavicular lymphadenopathy. Abdomen: Soft, non-tender, non-distended.  Umbilicus without lesions.  No hepatomegaly or masses palpable. No evidence of hernia. Genitourinary:  External: Atrophic changes.  Normal urethral meatus, normal Bartholin's and Skene's glands.    Vagina: Flattened rugae, no evidence of prolapse.    Cervix: Grossly normal in appearance, no bleeding, non-tender  Uterus: Anteflexed, irregularity on the right fundal area, mobile, and non-tender  Adnexa: No adnexal masses, non-tender  Rectal: deferred  Lymphatic: no evidence of inguinal lymphadenopathy Extremities: no edema, erythema, or tenderness Neurologic: Grossly intact Psychiatric: mood appropriate, affect full     Assessment: 57  y.o. annual gyn exam Osteoporosis of spine and osteopenia of femur Genital atrophy due to menopause Plan:    1) Breast cancer screening - recommend monthly self breast exam and annual screening mammograms. Mammogram was ordered today. Patient to call for appointment at [address] [facility]  2) DIscussed calcium and vitamin D3 requirements as well as exercise to combat osteoporosis.Repeat Dexa scan ordered  3) Cervical cancer screening - Pap was done.   4) Colon cancer screening UTD-due 2021  5) Routine healthcare maintenance including cholesterol and diabetes screening UTD  6) RTO in 1 year and prn  Dalia Heading, CNM

## 2018-09-19 LAB — CYTOLOGY - PAP: Diagnosis: NEGATIVE

## 2018-10-18 ENCOUNTER — Ambulatory Visit
Admission: RE | Admit: 2018-10-18 | Discharge: 2018-10-18 | Disposition: A | Discharge status: Home / Self Care | Payer: 59 | Source: Ambulatory Visit | Attending: Certified Nurse Midwife | Admitting: Certified Nurse Midwife

## 2018-10-18 DIAGNOSIS — Z1239 Encounter for other screening for malignant neoplasm of breast: Secondary | ICD-10-CM | POA: Insufficient documentation

## 2018-10-18 DIAGNOSIS — Z1231 Encounter for screening mammogram for malignant neoplasm of breast: Secondary | ICD-10-CM | POA: Diagnosis not present

## 2018-11-01 ENCOUNTER — Other Ambulatory Visit: Payer: Self-pay

## 2020-06-30 LAB — HEPATITIS B SURFACE ANTIGEN: Hepatitis B Surface Ag: 5.3

## 2020-06-30 LAB — BASIC METABOLIC PANEL
BUN: 15 (ref 4–21)
Creatinine: 0.9 (ref 0.5–1.1)
Glucose: 90

## 2020-06-30 LAB — CBC AND DIFFERENTIAL
HCT: 46 (ref 36–46)
Hemoglobin: 15.4 (ref 12.0–16.0)
Platelets: 272 (ref 150–399)
WBC: 4.9

## 2020-06-30 LAB — VITAMIN D 25 HYDROXY (VIT D DEFICIENCY, FRACTURES): Vit D, 25-Hydroxy: 64.3

## 2020-06-30 LAB — TSH: TSH: 1.8 (ref 0.41–5.90)

## 2020-06-30 LAB — TESTOSTERONE: Testosterone: 2.4

## 2020-06-30 LAB — CBC: RBC: 4.76 (ref 3.87–5.11)

## 2020-12-01 DIAGNOSIS — Z85820 Personal history of malignant melanoma of skin: Secondary | ICD-10-CM | POA: Diagnosis not present

## 2020-12-01 DIAGNOSIS — Z86018 Personal history of other benign neoplasm: Secondary | ICD-10-CM | POA: Diagnosis not present

## 2020-12-01 DIAGNOSIS — Z859 Personal history of malignant neoplasm, unspecified: Secondary | ICD-10-CM | POA: Diagnosis not present

## 2020-12-01 DIAGNOSIS — L570 Actinic keratosis: Secondary | ICD-10-CM | POA: Diagnosis not present

## 2020-12-01 DIAGNOSIS — L578 Other skin changes due to chronic exposure to nonionizing radiation: Secondary | ICD-10-CM | POA: Diagnosis not present

## 2021-02-06 ENCOUNTER — Other Ambulatory Visit: Payer: Self-pay

## 2021-02-06 ENCOUNTER — Ambulatory Visit: Payer: BC Managed Care – PPO | Admitting: Family Medicine

## 2021-02-06 ENCOUNTER — Encounter: Payer: Self-pay | Admitting: Family Medicine

## 2021-02-06 VITALS — BP 118/80 | HR 80 | Ht 64.5 in | Wt 131.0 lb

## 2021-02-06 DIAGNOSIS — Z7689 Persons encountering health services in other specified circumstances: Secondary | ICD-10-CM

## 2021-02-06 DIAGNOSIS — B009 Herpesviral infection, unspecified: Secondary | ICD-10-CM | POA: Diagnosis not present

## 2021-02-06 DIAGNOSIS — Z1211 Encounter for screening for malignant neoplasm of colon: Secondary | ICD-10-CM | POA: Diagnosis not present

## 2021-02-06 DIAGNOSIS — M810 Age-related osteoporosis without current pathological fracture: Secondary | ICD-10-CM | POA: Diagnosis not present

## 2021-02-06 DIAGNOSIS — E7800 Pure hypercholesterolemia, unspecified: Secondary | ICD-10-CM

## 2021-02-06 MED ORDER — VALACYCLOVIR HCL 1 G PO TABS: ORAL_TABLET | ORAL | 5 refills | Status: DC

## 2021-02-06 NOTE — Patient Instructions (Signed)

## 2021-02-06 NOTE — Progress Notes (Signed)
Date:  02/06/2021   Name:  Hailey Rivera   DOB:  02/05/1961   MRN:  [id number]   Chief Complaint: Establish Care and fever blister  Patient is a 60 year old female who presents for a establish care exam. The patient reports the following problems: osteoporosis. Health maintenance has been reviewed up to date.   Lab Results  Component Value Date   CREATININE 0.70 08/10/2017   BUN 11 08/10/2017   NA 142 08/10/2017   K 4.5 08/10/2017   CL 104 08/10/2017   CO2 26 08/10/2017   Lab Results  Component Value Date   CHOL 193 08/10/2017   HDL 67 08/10/2017   LDLCALC 115 (H) 08/10/2017   TRIG 55 08/10/2017   No results found for: TSH Lab Results  Component Value Date   HGBA1C 5.4 08/10/2017   No results found for: WBC, HGB, HCT, MCV, PLT Lab Results  Component Value Date   ALT 15 08/10/2017   AST 23 08/10/2017   ALKPHOS 114 08/10/2017   BILITOT 0.3 08/10/2017     Review of Systems  Constitutional: Negative.  Negative for chills, diaphoresis, fatigue, fever and unexpected weight change.  HENT: Negative for congestion, ear discharge, ear pain, rhinorrhea, sinus pressure, sneezing and sore throat.   Eyes: Negative for photophobia, pain, discharge, redness and itching.  Respiratory: Negative for apnea, cough, choking, chest tightness, shortness of breath, wheezing and stridor.   Cardiovascular: Negative for chest pain, palpitations and leg swelling.  Gastrointestinal: Negative for abdominal pain, blood in stool, constipation, diarrhea, nausea and vomiting.  Endocrine: Negative for cold intolerance, heat intolerance, polydipsia, polyphagia and polyuria.  Genitourinary: Negative for dysuria, flank pain, frequency, hematuria, menstrual problem, pelvic pain, urgency, vaginal bleeding and vaginal discharge.  Musculoskeletal: Negative for arthralgias, back pain and myalgias.  Skin: Negative for rash.  Allergic/Immunologic: Negative for environmental allergies and food allergies.   Neurological: Negative for dizziness, weakness, light-headedness, numbness and headaches.  Hematological: Negative for adenopathy. Does not bruise/bleed easily.  Psychiatric/Behavioral: Negative for dysphoric mood. The patient is not nervous/anxious.     Patient Active Problem List   Diagnosis Date Noted  . Osteoporosis of lumbar spine 08/27/2017  . Osteopenia of femoral neck 08/27/2017  . Hypercholesteremia 10/29/2015  . Melanoma ([place]) 10/09/2015  . Hypertension 10/09/2015  . Glossitis 10/09/2015  . Herpes simplex 10/09/2015  . Rosacea 10/09/2015    No Known Allergies  Past Surgical History:  Procedure Laterality Date  . AUGMENTATION MAMMAPLASTY Bilateral 1981  . [place]  . CKC  1985   CKC of cervix  . COLONOSCOPY  2011   internal hemorrhoids  . TONSILLECTOMY  1971  . WISDOM TOOTH EXTRACTION  age 24   age 31    Social History   Tobacco Use  . Smoking status: Never Smoker  . Smokeless tobacco: Never Used  Vaping Use  . Vaping Use: Never used  Substance Use Topics  . Alcohol use: Yes    Alcohol/week: 0.0 standard drinks    Comment: on occasion  . Drug use: No     Medication list has been reviewed and updated.  Current Meds  Medication Sig  . estradiol (VIVELLE-DOT) 0.0375 MG/24HR Place onto the skin.  . Prasterone (INTRAROSA) 6.5 MG INST Place vaginally.  . progesterone (PROMETRIUM) 200 MG capsule Take 200 mg by mouth at bedtime.  . tretinoin (RETIN-A) 0.1 % cream Apply topically at bedtime.  . valACYclovir (VALTREX) 1000 MG tablet Take 2000 mgm  q12hours x2 doses prn cold sores    PHQ 2/9 Scores 02/06/2021  PHQ - 2 Score 0  PHQ- 9 Score 0    GAD 7 : Generalized Anxiety Score 02/06/2021  Nervous, Anxious, on Edge 0  Control/stop worrying 0  Worry too much - different things 0  Trouble relaxing 0  Restless 0  Easily annoyed or irritable 0  Afraid - awful might happen 0  Total GAD 7 Score 0    BP Readings from Last 3 Encounters:   02/06/21 118/80  09/18/18 112/60  08/10/17 100/60    Physical Exam  Wt Readings from Last 3 Encounters:  02/06/21 131 lb (59.4 kg)  09/18/18 123 lb (55.8 kg)  08/10/17 126 lb (57.2 kg)    BP 118/80   Pulse 80   Ht 5' 4.5" (1.638 m)   Wt 131 lb (59.4 kg)   LMP 12/26/2013 (Approximate)   BMI 22.14 kg/m   Assessment and Plan:  1. Establishing care with new doctor, encounter for Patient establishing care for primary care with new physician.  On review patient has no abnormalities on review of systems and there is no pending concerns at this time  2. Osteoporosis of lumbar spine Chronic.  Persistent.  Controlled.  Patient is currently on hormone therapy including estrogen, progesterone, and testosterone.  I am not certain if this will be maintainable but we will refer to gynecology to be reestablished with [facility] and that will be determined how we will approach this. - Ambulatory referral to Gynecology  3. Herpes simplex Chronic.  Controlled.  Stable.  Continue valacyclovir - valACYclovir (VALTREX) 1000 MG tablet; Take 2000 mgm q12hours x2 doses prn cold sores  Dispense: 30 tablet; Refill: 5  4. Hypercholesteremia Chronic.  Controlled.  Stable.  Patient is currently not on medication for this and we will give information on [facility] for control we will probably recheck this in a fasting state in 6 months review of her most recent lipid labs is suggestive of elevated LDL.  5. Colon cancer screening Patient would like to establish care with [facility]/Dr. Vicente Males for upcoming colonoscopy which is due. - Ambulatory referral to Gastroenterology

## 2021-03-09 ENCOUNTER — Ambulatory Visit (INDEPENDENT_AMBULATORY_CARE_PROVIDER_SITE_OTHER): Payer: BC Managed Care – PPO | Admitting: Obstetrics and Gynecology

## 2021-03-09 ENCOUNTER — Encounter: Payer: Self-pay | Admitting: Obstetrics and Gynecology

## 2021-03-09 ENCOUNTER — Other Ambulatory Visit (HOSPITAL_COMMUNITY)
Admission: RE | Admit: 2021-03-09 | Discharge: 2021-03-09 | Disposition: A | Discharge status: Home / Self Care | Payer: BC Managed Care – PPO | Source: Ambulatory Visit | Attending: Obstetrics and Gynecology | Admitting: Obstetrics and Gynecology

## 2021-03-09 ENCOUNTER — Other Ambulatory Visit: Payer: Self-pay

## 2021-03-09 ENCOUNTER — Encounter: Payer: 59 | Admitting: Obstetrics and Gynecology

## 2021-03-09 VITALS — BP 110/72 | Ht 64.5 in | Wt 131.4 lb

## 2021-03-09 DIAGNOSIS — Z01419 Encounter for gynecological examination (general) (routine) without abnormal findings: Secondary | ICD-10-CM

## 2021-03-09 DIAGNOSIS — Z0000 Encounter for general adult medical examination without abnormal findings: Secondary | ICD-10-CM | POA: Diagnosis not present

## 2021-03-09 DIAGNOSIS — Z124 Encounter for screening for malignant neoplasm of cervix: Secondary | ICD-10-CM | POA: Diagnosis not present

## 2021-03-09 DIAGNOSIS — N838 Other noninflammatory disorders of ovary, fallopian tube and broad ligament: Secondary | ICD-10-CM

## 2021-03-09 DIAGNOSIS — Z1231 Encounter for screening mammogram for malignant neoplasm of breast: Secondary | ICD-10-CM

## 2021-03-09 DIAGNOSIS — Z79890 Hormone replacement therapy (postmenopausal): Secondary | ICD-10-CM

## 2021-03-09 DIAGNOSIS — M818 Other osteoporosis without current pathological fracture: Secondary | ICD-10-CM

## 2021-03-09 MED ORDER — ESTRADIOL 0.0375 MG/24HR TD PTTW: 1.0000 | MEDICATED_PATCH | TRANSDERMAL | 11 refills | Status: AC

## 2021-03-09 MED ORDER — PROGESTERONE 200 MG PO CAPS: 200.0000 mg | ORAL_CAPSULE | Freq: Every day | ORAL | 11 refills | Status: AC

## 2021-03-09 NOTE — Progress Notes (Signed)
Gynecology Annual Exam  PCP: Duanne Limerick, MD  Chief Complaint:  Chief Complaint  Patient presents with   Gynecologic Exam    History of Present Illness: Patient is a 60 y.o. Z6A1600 presents for annual exam. The patient has no complaints today.   LMP: Patient's last menstrual period was 12/26/2013 (approximate). She denies postmenopausal bleeding or spotting  The patient is sexually active. She denies dyspareunia.  Postcoital Bleeding: no   The patient does perform self breast exams.  There is no notable family history of breast cancer in her family.  The patient has regular exercise:   The patient denies current symptoms of depression.   She reports that she has been using hormone replacement therapy over the last year and seen improvement. She was following with a Doctor in [place] who prescribed her medications after a DUTCH test. She has paperwork from that clinic titled [facility] MD  She is taking progesterone 200 mg nightly, estradiol 0.0375mg /24 hrs twice weekly, testosterone 2mg /ml in cream. Disp 0.25 mL to inner labia nightly, and Intrarosa nightly at bedtime.  She would like refills for these medications. She currently has sufficient Intrarosa.   We discussed her diagnosis of osteoporosis. She would like to repeat a DEXA scan. She is hesitant to start medications such as bisphosphonate because of complications her mother had with that medication. She has been using HRT for additional bone support.   Review of Systems: Review of Systems  Constitutional:  Negative for chills, fever, malaise/fatigue and weight loss.  HENT:  Negative for congestion, hearing loss and sinus pain.   Eyes:  Negative for blurred vision and double vision.  Respiratory:  Negative for cough, sputum production, shortness of breath and wheezing.   Cardiovascular:  Negative for chest pain, palpitations, orthopnea and leg swelling.  Gastrointestinal:  Negative for abdominal pain,  constipation, diarrhea, nausea and vomiting.  Genitourinary:  Negative for dysuria, flank pain, frequency, hematuria and urgency.  Musculoskeletal:  Negative for back pain, falls and joint pain.  Skin:  Negative for itching and rash.  Neurological:  Negative for dizziness and headaches.  Psychiatric/Behavioral:  Negative for depression, substance abuse and suicidal ideas. The patient is not nervous/anxious.    Past Medical History:  Past Medical History:  Diagnosis Date   Cancer (HCC) 10/04/2012   melanoma   Fever blister    Fibroids    Glossitis    Herpes simplex    History of abnormal cervical Pap smear    cone biopsy 1985   Hypertension    history of   Osteopenia    Rosacea     Past Surgical History:  Past Surgical History:  Procedure Laterality Date   AUGMENTATION MAMMAPLASTY Bilateral 1981   CESAREAN SECTION  1991, 1993   CKC  1985   CKC of cervix   COLONOSCOPY  2011   internal hemorrhoids   TONSILLECTOMY  1971   WISDOM TOOTH EXTRACTION  age 27   age 71    Gynecologic History:  Patient's last menstrual period was 12/26/2013 (approximate). Last Pap: 2019 NIL Last mammogram: 2020 Results were: BI-RAD I  Obstetric History: 2021  Family History:  Family History  Problem Relation Age of Onset   Multiple myeloma Father 28   Heart disease Father    Hypertension Father    Cancer Father    Hypertension Mother    Hyperlipidemia Mother    Osteoporosis Mother    Hypertension Brother    Diabetes Maternal Grandmother  Alcohol abuse Maternal Grandfather    Hypertension Paternal Grandmother    Heart disease Paternal Grandmother    Heart disease Paternal Grandfather        MI   GI problems Brother    Diabetes Maternal Aunt    Ovarian cancer Other 47       ovarian vs uterine cancer   Diabetes Other    Pancreatic cancer Other 29   Heart disease Paternal Uncle     Social History:  Social History   Socioeconomic History   Marital status: Married     Spouse name: Not on file   Number of children: 2   Years of education: 16   Highest education level: Bachelor's degree (e.g., BA, AB, BS)  Occupational History   Occupation: RN  Tobacco Use   Smoking status: Never Smoker   Smokeless tobacco: Never Used  Vaping Use   Vaping Use: Never used  Substance and Sexual Activity   Alcohol use: Yes    Alcohol/week: 0.0 standard drinks    Comment: on occasion   Drug use: No   Sexual activity: Yes    Partners: Male    Birth control/protection: Post-menopausal  Other Topics Concern   Not on file  Social History Narrative   Not on file   Social Determinants of Health   Financial Resource Strain: Not on file  Food Insecurity: Not on file  Transportation Needs: Not on file  Physical Activity: Not on file  Stress: Not on file  Social Connections: Not on file  Intimate Partner Violence: Not on file    Allergies:  No Known Allergies  Medications: Prior to Admission medications   Medication Sig Start Date End Date Taking? Authorizing Provider  estradiol (VIVELLE-DOT) 0.0375 MG/24HR Place onto the skin. 01/30/21  Yes [provider]  Prasterone (INTRAROSA) 6.5 MG INST Place vaginally.   Yes [provider]  progesterone (PROMETRIUM) 200 MG capsule Take 200 mg by mouth at bedtime. 08/18/20  Yes [provider]  tretinoin (RETIN-A) 0.1 % cream Apply topically at bedtime. 10/29/15  Yes Kathrine Haddock, NP  valACYclovir (VALTREX) 1000 MG tablet Take 2000 mgm q12hours x2 doses prn cold sores 02/06/21  Yes Juline Patch, MD    Physical Exam Vitals: Blood pressure 110/72, height 5' 4.5" (1.638 m), weight 131 lb 6.4 oz (59.6 kg), last menstrual period 12/26/2013.  Physical Exam Constitutional:      Appearance: She is well-developed.  Genitourinary:     Genitourinary Comments: External: Normal appearing vulva. No lesions noted.  Speculum examination: Normal appearing cervix. No blood in the vaginal vault. No  discharge.   Bimanual examination: Uterus midline, non-tender, normal in size, shape and contour.  No CMT. No adnexal masses. No adnexal tenderness. Pelvis not fixed.  Breast Exam: breast equal without skin changes, nipple discharge, breast lump or enlarged lymph nodes   HENT:     Head: Normocephalic and atraumatic.  Neck:     Thyroid: No thyromegaly.  Cardiovascular:     Rate and Rhythm: Normal rate and regular rhythm.     Heart sounds: Normal heart sounds.  Pulmonary:     Effort: Pulmonary effort is normal.     Breath sounds: Normal breath sounds.  Abdominal:     General: Bowel sounds are normal. There is no distension.     Palpations: Abdomen is soft. There is no mass.  Musculoskeletal:     Cervical back: Neck supple.  Neurological:     Mental Status: She is  alert and oriented to person, place, and time.  Skin:    General: Skin is warm and dry.  Psychiatric:        Behavior: Behavior normal.        Thought Content: Thought content normal.        Judgment: Judgment normal.  Vitals reviewed.     Female chaperone present for pelvic and breast  portions of the physical exam  Assessment: 60 y.o. G2P2002 routine annual exam  Plan: Problem List Items Addressed This Visit   None Visit Diagnoses     Encounter for annual routine gynecological examination    -  Primary   Health maintenance examination       Breast cancer screening by mammogram       Relevant Orders   MM 3D SCREEN BREAST BILATERAL   Cervical cancer screening       Relevant Orders   Cytology - PAP (Completed)   Other osteoporosis without current pathological fracture       Relevant Orders   DG Bone Density   Hormone replacement therapy       Relevant Medications   estradiol (VIVELLE-DOT) 0.0375 MG/24HR   progesterone (PROMETRIUM) 200 MG capsule   Ovarian enlargement, right       Relevant Orders   US PELVIS TRANSVAGINAL NON-OB (TV ONLY)       1) Mammogram - recommend yearly screening mammogram.   Mammogram was ordered today  2) STI screening was offered and declined  3) ASCCP guidelines and rational discussed.  Pap smear performed today.  4) Colonoscopy -- patient reports she is scheduled soon for a colonoscopy.   5) Routine healthcare maintenance including cholesterol, diabetes screening discussed managed by PCP  6) Osteoporosis in spine on DEXA scan. Patient provided with information on osteoporosis. Discussed treatment options. Reports her mother had a negative experience with bisphosphonate medication.   7) Hormone replacement therapy- refills for medication (estrogen and progesterone) sent. Discussed that testosterone is not recommended for replacement routinely.    Adrian Prows MD, [facility], [facility] 03/09/2021 3:43 PM

## 2021-03-09 NOTE — Patient Instructions (Signed)
[facility]stitute of [facility] [place] [facility] for Calcium and Vitamin D  Age (yr) Calcium Recommended Dietary Allowance (mg/day) Vitamin D Recommended Dietary Allowance (international units/day)  9-18 1,300 600  19-50 1,000 600  51-70 1,200 600  71 and older 1,200 800  Data from Institute of Medicine. Dietary reference intakes: calcium, vitamin D. [place], [place]: [employer]; 2011.    Exercising to Stay Healthy To become healthy and stay healthy, it is recommended that you do moderate-intensity and vigorous-intensity exercise. You can tell that you are exercising at a moderate intensity if your heart starts beating faster and you start breathing faster but can still hold a conversation. You can tell that you are exercising at a vigorous intensity if you are breathing much harder and faster and cannot hold a conversation while exercising. Exercising regularly is important. It has many health benefits, such as:  Improving overall fitness, flexibility, and endurance.  Increasing bone density.  Helping with weight control.  Decreasing body fat.  Increasing muscle strength.  Reducing stress and tension.  Improving overall health. How often should I exercise? Choose an activity that you enjoy, and set realistic goals. Your health care provider can help you make an activity plan that works for you. Exercise regularly as told by your health care provider. This may include:  Doing strength training two times a week, such as: ? Lifting weights. ? Using resistance bands. ? Push-ups. ? Sit-ups. ? Yoga.  Doing a certain intensity of exercise for a given amount of time. Choose from these options: ? A total of 150 minutes of moderate-intensity exercise every week. ? A total of 75 minutes of vigorous-intensity exercise every week. ? A mix of moderate-intensity and vigorous-intensity exercise every week. Children, pregnant women, people who have not exercised  regularly, people who are overweight, and older adults may need to talk with a health care provider about what activities are safe to do. If you have a medical condition, be sure to talk with your health care provider before you start a new exercise program. What are some exercise ideas? Moderate-intensity exercise ideas include:  Walking 1 mile (1.6 km) in about 15 minutes.  Biking.  Hiking.  Golfing.  Dancing.  Water aerobics. Vigorous-intensity exercise ideas include:  Walking 4.5 miles (7.2 km) or more in about 1 hour.  Jogging or running 5 miles (8 km) in about 1 hour.  Biking 10 miles (16.1 km) or more in about 1 hour.  Lap swimming.  Roller-skating or in-line skating.  Cross-country skiing.  Vigorous competitive sports, such as football, basketball, and soccer.  Jumping rope.  Aerobic dancing.   What are some everyday activities that can help me to get exercise?  [place] work, such as: ? Pushing a Conservation officer, nature. ? Raking and bagging leaves.  Washing your car.  Pushing a stroller.  Shoveling snow.  Gardening.  Washing windows or floors. How can I be more active in my day-to-day activities?  Use stairs instead of an elevator.  Take a walk during your lunch break.  If you drive, park your car farther away from your work or school.  If you take public transportation, get off one stop early and walk the rest of the way.  Stand up or walk around during all of your indoor phone calls.  Get up, stretch, and walk around every 30 minutes throughout the day.  Enjoy exercise with a friend. Support to continue exercising will help you keep a regular routine of activity. What guidelines  can I follow while exercising?  Before you start a new exercise program, talk with your health care provider.  Do not exercise so much that you hurt yourself, feel dizzy, or get very short of breath.  Wear comfortable clothes and wear shoes with good support.  Drink plenty of  water while you exercise to prevent dehydration or heat stroke.  Work out until your breathing and your heartbeat get faster. Where to find more information  U.S. Department of Health and Human Services: www.hhs.gov  Centers for Disease Control and Prevention (CDC): www.cdc.gov Summary  Exercising regularly is important. It will improve your overall fitness, flexibility, and endurance.  Regular exercise also will improve your overall health. It can help you control your weight, reduce stress, and improve your bone density.  Do not exercise so much that you hurt yourself, feel dizzy, or get very short of breath.  Before you start a new exercise program, talk with your health care provider. This information is not intended to replace advice given to you by your health care provider. Make sure you discuss any questions you have with your health care provider. Document Revised: 09/02/2017 Document Reviewed: 08/11/2017 Elsevier Patient Education  2021 Elsevier Inc.   Budget-Friendly Healthy Eating There are many ways to save money at the grocery store and continue to eat healthy. You can be successful if you:  Plan meals according to your budget.  Make a grocery list and only purchase food according to your grocery list.  Prepare food yourself at home. What are tips for following this plan? Reading food labels  Compare food labels between brand name foods and the store brand. Often the nutritional value is the same, but the store brand is lower cost.  Look for products that do not have added sugar, fat, or salt (sodium). These often cost the same but are healthier for you. Products may be labeled as: ? Sugar-free. ? Nonfat. ? Low-fat. ? Sodium-free. ? Low-sodium.  Look for lean ground beef labeled as at least 92% lean and 8% fat. Shopping  Buy only the items on your grocery list and go only to the areas of the store that have the items on your list.  Use coupons only for  foods and brands you normally buy. Avoid buying items you wouldn't normally buy simply because they are on sale.  Check online and in newspapers for weekly deals.  Buy healthy items from the bulk bins when available, such as herbs, spices, flour, pasta, nuts, and dried fruit.  Buy fruits and vegetables that are in season. Prices are usually lower on in-season produce.  Look at the unit price on the price tag. Use it to compare different brands and sizes to find out which item is the best deal.  Choose healthy items that are often low-cost, such as carrots, potatoes, apples, bananas, and oranges. Dried or canned beans are a low-cost protein source.  Buy in bulk and freeze extra food. Items you can buy in bulk include meats, fish, poultry, frozen fruits, and frozen vegetables.  Avoid buying "ready-to-eat" foods, such as pre-cut fruits and vegetables and pre-made salads.  If possible, shop around to discover where you can find the best prices. Consider other retailers such as dollar stores, larger wholesale stores, local fruit and vegetable stands, and farmers markets.  Do not shop when you are hungry. If you shop while hungry, it may be hard to stick to your list and budget.  Resist impulse buying. Use your grocery   list as your official plan for the week.  Buy a variety of vegetables and fruits by purchasing fresh, frozen, and canned items.  Look at the top and bottom shelves for deals. Foods at eye level (eye level of an adult or child) are usually more expensive.  Be efficient with your time when shopping. The more time you spend at the store, the more money you are likely to spend.  To save money when choosing more expensive foods like meats and dairy: ? Choose cheaper cuts of meat, such as bone-in chicken thighs and drumsticks instead of skinless and boneless chicken. When you are ready to prepare the chicken, you can remove the skin yourself to make it healthier. ? Choose lean meats  like chicken or [place] instead of beef. ? Choose canned seafood, such as tuna, salmon, or sardines. ? Buy eggs as a low-cost source of protein. ? Buy dried beans and peas, such as lentils, split peas, or kidney beans instead of meats. Dried beans and peas are a good alternative source of protein. ? Buy the larger tubs of yogurt instead of individual-sized containers.  Choose water instead of sodas and other sweetened beverages.  Avoid buying chips, cookies, and other "junk food." These items are usually expensive and not healthy.   Cooking  Make extra food and freeze the extras in meal-sized containers or in individual portions for fast meals and snacks.  Pre-cook on days when you have extra time to prepare meals in advance. You can keep these meals in the fridge or freezer and reheat for a quick meal.  When you come home from the grocery store, wash, peel, and cut fruits and vegetables so they are ready to use and eat. This will help reduce food waste. Meal planning  Do not eat out or get fast food. Prepare food at home.  Make a grocery list and make sure to bring it with you to the store. If you have a smart phone, you could use your phone to create your shopping list.  Plan meals and snacks according to a grocery list and budget you create.  Use leftovers in your meal plan for the week.  Look for recipes where you can cook once and make enough food for two meals.  Prepare budget-friendly types of meals like stews, casseroles, and stir-fry dishes.  Try some meatless meals or try "no cook" meals like salads.  Make sure that half your plate is filled with fruits or vegetables. Choose from fresh, frozen, or canned fruits and vegetables. If eating canned, remember to rinse them before eating. This will remove any excess salt added for packaging. Summary  Eating healthy on a budget is possible if you plan your meals according to your budget, purchase according to your budget and  grocery list, and prepare food yourself.  Tips for buying more food on a limited budget include buying generic brands, using coupons only for foods you normally buy, and buying healthy items from the bulk bins when available.  Tips for buying cheaper food to replace expensive food include choosing cheaper, lean cuts of meat, and buying dried beans and peas. This information is not intended to replace advice given to you by your health care provider. Make sure you discuss any questions you have with your health care provider. Document Revised: 07/03/2020 Document Reviewed: 07/03/2020 Elsevier Patient Education  [place] protect organs, store calcium, anchor muscles, and support the whole body. Keeping your bones  strong is important, especially as you get older. You can take actions to help keep your bones strong and healthy. Why is keeping my bones healthy important? Keeping your bones healthy is important because your body constantly replaces bone cells. Cells get old, and new cells take their place. As we age, we lose bone cells because the body may not be able to make enough new cells to replace the old cells. The amount of bone cells and bone tissue you have is referred to as bone mass. The higher your bone mass, the stronger your bones. The aging process leads to an overall loss of bone mass in the body, which can increase the likelihood of:  Joint pain and stiffness.  Broken bones.  A condition in which the bones become weak and brittle (osteoporosis). A large decline in bone mass occurs in older adults. In women, it occurs about the time of menopause.   What actions can I take to keep my bones healthy? Good health habits are important for maintaining healthy bones. This includes eating nutritious foods and exercising regularly. To have healthy bones, you need to get enough of the right minerals and vitamins. Most nutrition experts recommend getting these  nutrients from the foods that you eat. In some cases, taking supplements may also be recommended. Doing certain types of exercise is also important for bone health. What are the nutritional recommendations for healthy bones? Eating a well-balanced diet with plenty of calcium and vitamin D will help to protect your bones. Nutritional recommendations vary from person to person. Ask your health care provider what is healthy for you. Here are some general guidelines. Get enough calcium Calcium is the most important (essential) mineral for bone health. Most people can get enough calcium from their diet, but supplements may be recommended for people who are at risk for osteoporosis. Good sources of calcium include:  Dairy products, such as low-fat or nonfat milk, cheese, and yogurt.  Dark green leafy vegetables, such as bok choy and broccoli.  Calcium-fortified foods, such as orange juice, cereal, bread, soy beverages, and tofu products.  Nuts, such as almonds. Follow these recommended amounts for daily calcium intake:  Children, age 1-3: 700 mg.  Children, age 4-8: 1,000 mg.  Children, age 9-13: 1,300 mg.  Teens, age 14-18: 1,300 mg.  Adults, age 19-50: 1,000 mg.  Adults, age 51-70: ? Men: 1,000 mg. ? Women: 1,200 mg.  Adults, age 71 or older: 1,200 mg.  Pregnant and breastfeeding females: ? Teens: 1,300 mg. ? Adults: 1,000 mg. Get enough vitamin D Vitamin D is the most essential vitamin for bone health. It helps the body absorb calcium. Sunlight stimulates the skin to make vitamin D, so be sure to get enough sunlight. If you live in a cold climate or you do not get outside often, your health care provider may recommend that you take vitamin D supplements. Good sources of vitamin D in your diet include:  Egg yolks.  Saltwater fish.  Milk and cereal fortified with vitamin D. Follow these recommended amounts for daily vitamin D intake:  Children and teens, age 1-18: 600  international units.  Adults, age 50 or younger: 400-800 international units.  Adults, age 51 or older: 800-1,000 international units. Get other important nutrients Other nutrients that are important for bone health include:  Phosphorus. This mineral is found in meat, poultry, dairy foods, nuts, and legumes. The recommended daily intake for adult men and adult women is 700 mg.  Magnesium. This mineral   is found in seeds, nuts, dark green vegetables, and legumes. The recommended daily intake for adult men is 400-420 mg. For adult women, it is 310-320 mg.  Vitamin K. This vitamin is found in green leafy vegetables. The recommended daily intake is 120 mg for adult men and 90 mg for adult women.   What type of physical activity is best for building and maintaining healthy bones? Weight-bearing and strength-building activities are important for building and maintaining healthy bones. Weight-bearing activities cause muscles and bones to work against gravity. Strength-building activities increase the strength of the muscles that support bones. Weight-bearing and muscle-building activities include:  Walking and hiking.  Jogging and running.  Dancing.  Gym exercises.  Lifting weights.  Tennis and racquetball.  Climbing stairs.  Aerobics. Adults should get at least 30 minutes of moderate physical activity on most days. Children should get at least 60 minutes of moderate physical activity on most days. Ask your health care provider what type of exercise is best for you.   How can I find out if my bone mass is low? Bone mass can be measured with an X-ray test called a bone mineral density (BMD) test. This test is recommended for all women who are age 40 or older. It may also be recommended for:  Men who are age 29 or older.  People who are at risk for osteoporosis because of: ? Having bones that break easily. ? Having a long-term disease that weakens bones, such as kidney disease or  rheumatoid arthritis. ? Having menopause earlier than normal. ? Taking medicine that weakens bones, such as steroids, thyroid hormones, or hormone treatment for breast cancer or prostate cancer. ? Smoking. ? Drinking three or more alcoholic drinks a day. If you find that you have a low bone mass, you may be able to prevent osteoporosis or further bone loss by changing your diet and lifestyle. Where can I find more information? For more information, check out the following websites:  [place]: AviationTales.fr  [employer] of Health: www.bones.SouthExposed.es  International Osteoporosis Foundation: Administrator.iofbonehealth.org Summary  The aging process leads to an overall loss of bone mass in the body, which can increase the likelihood of broken bones and osteoporosis.  Eating a well-balanced diet with plenty of calcium and vitamin D will help to protect your bones.  Weight-bearing and strength-building activities are also important for building and maintaining strong bones.  Bone mass can be measured with an X-ray test called a bone mineral density (BMD) test. This information is not intended to replace advice given to you by your health care provider. Make sure you discuss any questions you have with your health care provider. Document Revised: 10/17/2017 Document Reviewed: 10/17/2017 Elsevier Patient Education  2021 [place].   Osteoporosis  Osteoporosis happens when the bones become thin and less dense than normal. Osteoporosis makes bones more brittle and fragile and more likely to break (fracture). Over time, osteoporosis can cause your bones to become so weak that they fracture after a minor fall. Bones in the hip, wrist, and spine are most likely to fracture due to osteoporosis. What are the causes? The exact cause of this condition is not known. What increases the risk? You are more likely to develop this condition if you:  Have family members  with this condition.  Have poor nutrition.  Use the following: ? Steroid medicines, such as prednisone. ? Anti-seizure medicines. ? Nicotine or tobacco, such as cigarettes, e-cigarettes, and chewing tobacco.  Are female.  Are age 32 or older.  Are not physically active (are sedentary).  Are of European or Asian descent.  Have a small body frame. What are the signs or symptoms? A fracture might be the first sign of osteoporosis, especially if the fracture results from a fall or injury that usually would not cause a bone to break. Other signs and symptoms include:  Pain in the neck or low back.  Stooped posture.  Loss of height. How is this diagnosed? This condition may be diagnosed based on:  Your medical history.  A physical exam.  A bone mineral density test, also called a DXA or DEXA test (dual-energy X-ray absorptiometry test). This test uses X-rays to measure the amount of minerals in your bones. How is this treated? This condition may be treated by:  Making lifestyle changes, such as: ? Including foods with more calcium and vitamin D in your diet. ? Doing weight-bearing and muscle-strengthening exercises. ? Stopping tobacco use. ? Limiting alcohol intake.  Taking medicine to slow the process of bone loss or to increase bone density.  Taking daily supplements of calcium and vitamin D.  Taking hormone replacement medicines, such as estrogen for women and testosterone for men.  Monitoring your levels of calcium and vitamin D. The goal of treatment is to strengthen your bones and lower your risk for a fracture. Follow these instructions at home: Eating and drinking Include calcium and vitamin D in your diet. Calcium is important for bone health, and vitamin D helps your body absorb calcium. Good sources of calcium and vitamin D include:  Certain fatty fish, such as salmon and tuna.  Products that have calcium and vitamin D added to them (are fortified), such  as fortified cereals.  Egg yolks.  Cheese.  Liver.   Activity Do exercises as told by your health care provider. Ask your health care provider what exercises and activities are safe for you. You should do:  Exercises that make you work against gravity (weight-bearing exercises), such as tai chi, yoga, or walking.  Exercises to strengthen muscles, such as lifting weights. Lifestyle  Do not drink alcohol if: ? Your health care provider tells you not to drink. ? You are pregnant, may be pregnant, or are planning to become pregnant.  If you drink alcohol: ? Limit how much you use to:  0-1 drink a day for women.  0-2 drinks a day for men.  Know how much alcohol is in your drink. In the [place] one drink equals one 12 oz bottle of beer (355 mL), one 5 oz glass of wine (148 mL), or one 1 oz glass of hard liquor (44 mL).  Do not use any products that contain nicotine or tobacco, such as cigarettes, e-cigarettes, and chewing tobacco. If you need help quitting, ask your health care provider. Preventing falls  Use devices to help you move around (mobility aids) as needed, such as canes, walkers, scooters, or crutches.  Keep rooms well-lit and clutter-free.  Remove tripping hazards from walkways, including cords and throw rugs.  Install grab bars in bathrooms and safety rails on stairs.  Use rubber mats in the bathroom and other areas that are often wet or slippery.  Wear closed-toe shoes that fit well and support your feet. Wear shoes that have rubber soles or low heels.  Review your medicines with your health care provider. Some medicines can cause dizziness or changes in blood pressure, which can increase your risk of falling. General instructions  Take over-the-counter  and prescription medicines only as told by your health care provider.  Keep all follow-up visits. This is important. Contact a health care provider if:  You have never been screened for osteoporosis and you  are: ? A woman who is age 66 or older. ? A man who is age 12 or older. Get help right away if:  You fall or injure yourself. Summary  Osteoporosis is thinning and loss of density in your bones. This makes bones more brittle and fragile and more likely to break (fracture),even with minor falls.  The goal of treatment is to strengthen your bones and lower your risk for a fracture.  Include calcium and vitamin D in your diet. Calcium is important for bone health, and vitamin D helps your body absorb calcium.  Talk with your health care provider about screening for osteoporosis if you are a woman who is age 44 or older, or a man who is age 48 or older. This information is not intended to replace advice given to you by your health care provider. Make sure you discuss any questions you have with your health care provider. Document Revised: 03/06/2020 Document Reviewed: 03/06/2020 Elsevier Patient Education  [place].

## 2021-03-11 LAB — CYTOLOGY - PAP
Comment: NEGATIVE
Diagnosis: NEGATIVE
High risk HPV: NEGATIVE

## 2021-03-12 ENCOUNTER — Other Ambulatory Visit: Payer: Self-pay | Admitting: Obstetrics and Gynecology

## 2021-03-12 DIAGNOSIS — N838 Other noninflammatory disorders of ovary, fallopian tube and broad ligament: Secondary | ICD-10-CM

## 2021-03-30 ENCOUNTER — Ambulatory Visit
Admission: RE | Admit: 2021-03-30 | Discharge: 2021-03-30 | Disposition: A | Discharge status: Home / Self Care | Payer: BC Managed Care – PPO | Source: Ambulatory Visit | Attending: Obstetrics and Gynecology | Admitting: Obstetrics and Gynecology

## 2021-03-30 ENCOUNTER — Other Ambulatory Visit: Payer: Self-pay

## 2021-03-30 DIAGNOSIS — M8589 Other specified disorders of bone density and structure, multiple sites: Secondary | ICD-10-CM | POA: Diagnosis not present

## 2021-03-30 DIAGNOSIS — Z1231 Encounter for screening mammogram for malignant neoplasm of breast: Secondary | ICD-10-CM | POA: Diagnosis not present

## 2021-03-30 DIAGNOSIS — M818 Other osteoporosis without current pathological fracture: Secondary | ICD-10-CM | POA: Diagnosis not present

## 2021-03-30 DIAGNOSIS — Z780 Asymptomatic menopausal state: Secondary | ICD-10-CM | POA: Diagnosis not present

## 2021-03-31 ENCOUNTER — Telehealth (INDEPENDENT_AMBULATORY_CARE_PROVIDER_SITE_OTHER): Payer: BC Managed Care – PPO | Admitting: Gastroenterology

## 2021-03-31 DIAGNOSIS — Z1211 Encounter for screening for malignant neoplasm of colon: Secondary | ICD-10-CM

## 2021-03-31 MED ORDER — NA SULFATE-K SULFATE-MG SULF 17.5-3.13-1.6 GM/177ML PO SOLN: 1.0000 | Freq: Once | ORAL | 0 refills | Status: AC

## 2021-03-31 NOTE — Progress Notes (Signed)
Gastroenterology Pre-Procedure Review  Request Date: 04/13/21 Requesting Physician: Dr. Marius Ditch  PATIENT REVIEW QUESTIONS: The patient responded to the following health history questions as indicated:    1. Are you having any GI issues? no 2. Do you have a personal history of Polyps?  10/04/2009, no polyps were removed 3. Do you have a family history of Colon Cancer or Polyps? yes (brother colon polyps) 4. Diabetes Mellitus? no 5. Joint replacements in the past 12 months?no 6. Major health problems in the past 3 months?no 7. Any artificial heart valves, MVP, or defibrillator?no    MEDICATIONS & ALLERGIES:    Patient reports the following regarding taking any anticoagulation/antiplatelet therapy:   Plavix, Coumadin, Eliquis, Xarelto, Lovenox, Pradaxa, Brilinta, or Effient? no Aspirin? no  Patient confirms/reports the following medications:  Current Outpatient Medications  Medication Sig Dispense Refill   estradiol (VIVELLE-DOT) 0.0375 MG/24HR Place 1 patch onto the skin 2 (two) times a week. 8 patch 11   Na Sulfate-K Sulfate-Mg Sulf 17.5-3.13-1.6 GM/177ML SOLN Take 1 kit by mouth once for 1 dose. 354 mL 0   Prasterone (INTRAROSA) 6.5 MG INST Place vaginally.     progesterone (PROMETRIUM) 200 MG capsule Take 1 capsule (200 mg total) by mouth daily. 30 capsule 11   tretinoin (RETIN-A) 0.1 % cream Apply topically at bedtime. 45 g 3   valACYclovir (VALTREX) 1000 MG tablet Take 2000 mgm q12hours x2 doses prn cold sores 30 tablet 5   No current facility-administered medications for this visit.    Patient confirms/reports the following allergies:  No Known Allergies  No orders of the defined types were placed in this encounter.   AUTHORIZATION INFORMATION Primary Insurance: 1D#: Group #:  Secondary Insurance: 1D#: Group #:  SCHEDULE INFORMATION: Date: 04/13/21 Time: Location: [place]

## 2021-04-03 ENCOUNTER — Other Ambulatory Visit: Payer: Self-pay | Admitting: *Deleted

## 2021-04-03 ENCOUNTER — Inpatient Hospital Stay
Admission: RE | Admit: 2021-04-03 | Discharge: 2021-04-03 | Disposition: A | Discharge status: Home / Self Care | Payer: Self-pay | Source: Ambulatory Visit | Attending: *Deleted | Admitting: *Deleted

## 2021-04-03 DIAGNOSIS — Z1231 Encounter for screening mammogram for malignant neoplasm of breast: Secondary | ICD-10-CM

## 2021-04-13 ENCOUNTER — Ambulatory Visit
Admission: RE | Admit: 2021-04-13 | Discharge: 2021-04-13 | Disposition: A | Discharge status: Home / Self Care | Payer: BC Managed Care – PPO | Attending: Gastroenterology | Admitting: Gastroenterology

## 2021-04-13 ENCOUNTER — Encounter
Admission: RE | Disposition: A | Discharge status: Home / Self Care | Payer: Self-pay | Source: Home / Self Care | Attending: Gastroenterology

## 2021-04-13 ENCOUNTER — Ambulatory Visit: Payer: BC Managed Care – PPO | Admitting: Anesthesiology

## 2021-04-13 DIAGNOSIS — Z8249 Family history of ischemic heart disease and other diseases of the circulatory system: Secondary | ICD-10-CM | POA: Insufficient documentation

## 2021-04-13 DIAGNOSIS — Z8262 Family history of osteoporosis: Secondary | ICD-10-CM | POA: Diagnosis not present

## 2021-04-13 DIAGNOSIS — Z1211 Encounter for screening for malignant neoplasm of colon: Secondary | ICD-10-CM | POA: Insufficient documentation

## 2021-04-13 DIAGNOSIS — Z8349 Family history of other endocrine, nutritional and metabolic diseases: Secondary | ICD-10-CM | POA: Diagnosis not present

## 2021-04-13 DIAGNOSIS — Z833 Family history of diabetes mellitus: Secondary | ICD-10-CM | POA: Insufficient documentation

## 2021-04-13 DIAGNOSIS — I10 Essential (primary) hypertension: Secondary | ICD-10-CM | POA: Diagnosis not present

## 2021-04-13 DIAGNOSIS — Z8041 Family history of malignant neoplasm of ovary: Secondary | ICD-10-CM | POA: Insufficient documentation

## 2021-04-13 DIAGNOSIS — Z800 Family history of malignant neoplasm of digestive organs: Secondary | ICD-10-CM | POA: Insufficient documentation

## 2021-04-13 DIAGNOSIS — Z79899 Other long term (current) drug therapy: Secondary | ICD-10-CM | POA: Diagnosis not present

## 2021-04-13 HISTORY — PX: COLONOSCOPY WITH PROPOFOL: SHX5780

## 2021-04-13 SURGERY — COLONOSCOPY WITH PROPOFOL
Anesthesia: General

## 2021-04-13 MED ORDER — PROPOFOL 10 MG/ML IV BOLUS
INTRAVENOUS | Status: DC | PRN
  Administered 2021-04-13: 90 mg via INTRAVENOUS

## 2021-04-13 MED ORDER — PROPOFOL 500 MG/50ML IV EMUL
INTRAVENOUS | Status: DC | PRN
  Administered 2021-04-13: 130 ug/kg/min via INTRAVENOUS

## 2021-04-13 MED ORDER — LIDOCAINE HCL (CARDIAC) PF 100 MG/5ML IV SOSY
PREFILLED_SYRINGE | INTRAVENOUS | Status: DC | PRN
  Administered 2021-04-13: 50 mg via INTRAVENOUS

## 2021-04-13 MED ORDER — SODIUM CHLORIDE 0.9 % IV SOLN
INTRAVENOUS | Status: DC
  Administered 2021-04-13: 20 mL/h via INTRAVENOUS

## 2021-04-13 NOTE — Transfer of Care (Signed)
Immediate Anesthesia Transfer of Care Note  Patient: Hailey Rivera  Procedure(s) Performed: COLONOSCOPY WITH PROPOFOL  Patient Location: Endoscopy Unit  Anesthesia Type:General  Level of Consciousness: awake, alert  and oriented  Airway & Oxygen Therapy: Patient Spontanous Breathing  Post-op Assessment: Report given to RN and Post -op Vital signs reviewed and stable  Post vital signs: Reviewed and stable  Last Vitals:  Vitals Value Taken Time  BP 96/51 04/13/21 1216  Temp    Pulse 70 04/13/21 1216  Resp 14 04/13/21 1216  SpO2 100 % 04/13/21 1216  Vitals shown include unvalidated device data.  Last Pain:  Vitals:   04/13/21 1024  TempSrc: Temporal  PainSc: 0-No pain         Complications: No notable events documented.

## 2021-04-13 NOTE — Anesthesia Postprocedure Evaluation (Signed)
Anesthesia Post Note  Patient: BIJAL SIGLIN  Procedure(s) Performed: COLONOSCOPY WITH PROPOFOL  Patient location during evaluation: Endoscopy Anesthesia Type: General Level of consciousness: awake and alert and oriented Pain management: pain level controlled Vital Signs Assessment: post-procedure vital signs reviewed and stable Respiratory status: spontaneous breathing, nonlabored ventilation and respiratory function stable Cardiovascular status: blood pressure returned to baseline and stable Postop Assessment: no signs of nausea or vomiting Anesthetic complications: no   No notable events documented.   Last Vitals:  Vitals:   04/13/21 1226 04/13/21 1234  BP: (!) 119/98 (!) 124/99  Pulse: 88 61  Resp: 16 11  Temp:    SpO2: 100% 100%    Last Pain:  Vitals:   04/13/21 1234  TempSrc:   PainSc: 0-No pain                 Justice Milliron

## 2021-04-13 NOTE — Anesthesia Preprocedure Evaluation (Signed)
Anesthesia Evaluation  Patient identified by MRN, date of birth, ID band Patient awake    Reviewed: Allergy & Precautions, NPO status , Patient's Chart, lab work & pertinent test results  History of Anesthesia Complications Negative for: history of anesthetic complications  Airway Mallampati: II  TM Distance: >3 FB Neck ROM: Full    Dental no notable dental hx.    Pulmonary neg pulmonary ROS, neg sleep apnea, neg COPD,    breath sounds clear to auscultation- rhonchi (-) wheezing      Cardiovascular Exercise Tolerance: Good hypertension, Pt. on medications (-) CAD, (-) Cardiac Stents and (-) CABG  Rhythm:Regular Rate:Normal - Systolic murmurs and - Diastolic murmurs    Neuro/Psych neg Seizures negative neurological ROS  negative psych ROS   GI/Hepatic negative GI ROS, Neg liver ROS,   Endo/Other  negative endocrine ROSneg diabetes  Renal/GU negative Renal ROS     Musculoskeletal negative musculoskeletal ROS (+)   Abdominal (+) - obese,   Peds  Hematology negative hematology ROS (+)   Anesthesia Other Findings Past Medical History: 10/04/2012: Cancer ([place])     Comment:  melanoma No date: Fever blister No date: Fibroids No date: Glossitis No date: Herpes simplex No date: History of abnormal cervical Pap smear     Comment:  cone biopsy 1985 No date: Hypertension     Comment:  history of No date: Osteopenia No date: Rosacea   Reproductive/Obstetrics                             Anesthesia Physical Anesthesia Plan  ASA: 2  Anesthesia Plan: General   Post-op Pain Management:    Induction: Intravenous  PONV Risk Score and Plan: 2 and Propofol infusion  Airway Management Planned: Natural Airway  Additional Equipment:   Intra-op Plan:   Post-operative Plan:   Informed Consent: I have reviewed the patients History and Physical, chart, labs and discussed the procedure including  the risks, benefits and alternatives for the proposed anesthesia with the patient or authorized representative who has indicated his/her understanding and acceptance.     Dental advisory given  Plan Discussed with: CRNA and Anesthesiologist  Anesthesia Plan Comments:         Anesthesia Quick Evaluation

## 2021-04-13 NOTE — Op Note (Signed)
[facility] Gastroenterology Patient Name: Hailey Rivera Procedure Date: 04/13/2021 11:33 AM MRN: [id number] Account #: [id number] Date of Birth: 29-May-1961 Admit Type: Outpatient Age: 60 Room: [facility] ENDO ROOM 1 Gender: Female Note Status: Finalized Procedure:             Colonoscopy Indications:           Screening for colorectal malignant neoplasm Providers:             Jonathon Bellows MD, MD Referring MD:          No Local Md, MD (Referring MD) Medicines:             Monitored Anesthesia Care Complications:         No immediate complications. Procedure:             Pre-Anesthesia Assessment:                        - Prior to the procedure, a History and Physical was                         performed, and patient medications, allergies and                         sensitivities were reviewed. The patient's tolerance                         of previous anesthesia was reviewed.                        - The risks and benefits of the procedure and the                         sedation options and risks were discussed with the                         patient. All questions were answered and informed                         consent was obtained.                        - ASA Grade Assessment: II - A patient with mild                         systemic disease.                        After obtaining informed consent, the colonoscope was                         passed under direct vision. Throughout the procedure,                         the patient's blood pressure, pulse, and oxygen                         saturations were monitored continuously. The                         Colonoscope was introduced through the anus  and                         advanced to the the cecum, identified by the                         appendiceal orifice. The colonoscopy was performed                         with ease. The patient tolerated the procedure well.                         The quality of  the bowel preparation was excellent. Findings:      The perianal and digital rectal examinations were normal.      The entire examined colon appeared normal on direct and retroflexion       views. Impression:            - The entire examined colon is normal on direct and                         retroflexion views.                        - No specimens collected. Recommendation:        - Discharge patient to home (with escort).                        - Resume previous diet.                        - Continue present medications.                        - Repeat colonoscopy in 10 years for screening                         purposes. Procedure Code(s):     --- Professional ---                        [phone], Colonoscopy, flexible; diagnostic, including                         collection of specimen(s) by brushing or washing, when                         performed (separate procedure) Diagnosis Code(s):     --- Professional ---                        Z12.11, Encounter for screening for malignant neoplasm                         of colon CPT copyright 2019 American Medical Association. All rights reserved. The codes documented in this report are preliminary and upon coder review may  be revised to meet current compliance requirements. Jonathon Bellows, MD Jonathon Bellows MD, MD 04/13/2021 12:11:53 PM This report has been signed electronically. Number of Addenda: 0 Note Initiated On: 04/13/2021 11:33 AM Scope Withdrawal Time: 0 hours 8 minutes 4 seconds  Total Procedure Duration: 0 hours 29 minutes 24 seconds  Estimated Blood Loss:  Estimated blood loss: none.      [facility]

## 2021-04-13 NOTE — H&P (Signed)
Jonathon Bellows, MD [address] [place], [place], [place], [address] [id number], [place], [place], [place], [address] Phone: [phone]  Fax: [phone]  Primary Care Physician:  Juline Patch, MD   Pre-Procedure History & Physical: HPI:  Hailey Rivera is a 60 y.o. female is here for an colonoscopy.   Past Medical History:  Diagnosis Date   Cancer ([place]) 10/04/2012   melanoma   Fever blister    Fibroids    Glossitis    Herpes simplex    History of abnormal cervical Pap smear    cone biopsy 1985   Hypertension    history of   Osteopenia    Rosacea     Past Surgical History:  Procedure Laterality Date   AUGMENTATION MAMMAPLASTY Bilateral [place]   CK[facility] of cervix   COLONOSCOPY  2011   internal hemorrhoids   TONSILLECTOMY  1971   WISDOM TOOTH EXTRACTION  age 42   age 60    Prior to Admission medications   Medication Sig Start Date End Date Taking? Authorizing Provider  estradiol (VIVELLE-DOT) 0.0375 MG/24HR Place 1 patch onto the skin 2 (two) times a week. 03/09/21  Yes Schuman, Stefanie Libel, MD  Prasterone (INTRAROSA) 6.5 MG INST Place vaginally.   Yes [provider]  progesterone (PROMETRIUM) 200 MG capsule Take 1 capsule (200 mg total) by mouth daily. 03/09/21  Yes Schuman, Christanna R, MD  tretinoin (RETIN-A) 0.1 % cream Apply topically at bedtime. 10/29/15  Yes Kathrine Haddock, NP  valACYclovir (VALTREX) 1000 MG tablet Take 2000 mgm q12hours x2 doses prn cold sores 02/06/21  Yes Juline Patch, MD    Allergies as of 03/31/2021   (No Known Allergies)    Family History  Problem Relation Age of Onset   Hypertension Mother    Hyperlipidemia Mother    Osteoporosis Mother    Multiple myeloma Father 53   Heart disease Father    Hypertension Father    Cancer Father    Diabetes Maternal Aunt    Heart disease Paternal Uncle    Diabetes Maternal Grandmother    Alcohol abuse Maternal Grandfather     Hypertension Paternal Grandmother    Heart disease Paternal Grandmother    Heart disease Paternal Grandfather        MI   Hypertension Brother    GI problems Brother    Ovarian cancer Other 1       ovarian vs uterine cancer   Diabetes Other    Pancreatic cancer Other 46    Social History   Socioeconomic History   Marital status: Married    Spouse name: Not on file   Number of children: 2   Years of education: 16   Highest education level: Bachelor's degree (e.g., BA, AB, BS)  Occupational History   Occupation: RN  Tobacco Use   Smoking status: Never   Smokeless tobacco: Never  Vaping Use   Vaping Use: Never used  Substance and Sexual Activity   Alcohol use: Yes    Alcohol/week: 0.0 standard drinks    Comment: on occasion   Drug use: No   Sexual activity: Yes    Partners: Male    Birth control/protection: Post-menopausal  Other Topics Concern   Not on file  Social History Narrative   Not on file   Social Determinants of Health   Financial Resource Strain: Not on file  Food  Insecurity: Not on file  Transportation Needs: Not on file  Physical Activity: Not on file  Stress: Not on file  Social Connections: Not on file  Intimate Partner Violence: Not on file    Review of Systems: See HPI, otherwise negative ROS  Physical Exam: BP (!) 176/70   Pulse 64   Temp (!) 96.3 F (35.7 C) (Temporal)   Resp 20   Ht _0  (1.626 m)   Wt 57.6 kg   LMP 12/26/2013 (Approximate)   SpO2 100%   BMI 21.80 kg/m  General:   Alert,  pleasant and cooperative in NAD Head:  Normocephalic and atraumatic. Neck:  Supple; no masses or thyromegaly. Lungs:  Clear throughout to auscultation, normal respiratory effort.    Heart:  +S1, +S2, Regular rate and rhythm, No edema. Abdomen:  Soft, nontender and nondistended. Normal bowel sounds, without guarding, and without rebound.   Neurologic:  Alert and  oriented x4;  grossly normal neurologically.  Impression/Plan: Hailey Rivera  is here for an colonoscopy to be performed for Screening colonoscopy average risk   Risks, benefits, limitations, and alternatives regarding  colonoscopy have been reviewed with the patient.  Questions have been answered.  All parties agreeable.   Jonathon Bellows, MD  04/13/2021, 11:34 AM

## 2021-04-14 ENCOUNTER — Encounter: Payer: Self-pay | Admitting: Gastroenterology

## 2021-04-15 ENCOUNTER — Other Ambulatory Visit: Payer: 59

## 2021-04-16 ENCOUNTER — Ambulatory Visit: Payer: BC Managed Care – PPO | Admitting: Obstetrics and Gynecology

## 2021-04-20 ENCOUNTER — Ambulatory Visit: Payer: BC Managed Care – PPO

## 2021-04-21 ENCOUNTER — Ambulatory Visit: Payer: BC Managed Care – PPO | Admitting: Obstetrics and Gynecology

## 2021-06-03 DIAGNOSIS — L578 Other skin changes due to chronic exposure to nonionizing radiation: Secondary | ICD-10-CM | POA: Diagnosis not present

## 2021-06-03 DIAGNOSIS — L700 Acne vulgaris: Secondary | ICD-10-CM | POA: Diagnosis not present

## 2021-06-03 DIAGNOSIS — L570 Actinic keratosis: Secondary | ICD-10-CM | POA: Diagnosis not present

## 2021-06-03 DIAGNOSIS — L821 Other seborrheic keratosis: Secondary | ICD-10-CM | POA: Diagnosis not present

## 2022-02-11 ENCOUNTER — Other Ambulatory Visit: Payer: Self-pay | Admitting: Gynecology

## 2022-02-11 DIAGNOSIS — R6882 Decreased libido: Secondary | ICD-10-CM | POA: Diagnosis not present

## 2022-02-11 DIAGNOSIS — Z1231 Encounter for screening mammogram for malignant neoplasm of breast: Secondary | ICD-10-CM

## 2022-02-11 DIAGNOSIS — N951 Menopausal and female climacteric states: Secondary | ICD-10-CM | POA: Diagnosis not present

## 2022-02-17 DIAGNOSIS — Z13220 Encounter for screening for lipoid disorders: Secondary | ICD-10-CM | POA: Diagnosis not present

## 2022-02-17 DIAGNOSIS — R6882 Decreased libido: Secondary | ICD-10-CM | POA: Diagnosis not present

## 2022-02-17 DIAGNOSIS — N951 Menopausal and female climacteric states: Secondary | ICD-10-CM | POA: Diagnosis not present

## 2022-02-17 DIAGNOSIS — Z131 Encounter for screening for diabetes mellitus: Secondary | ICD-10-CM | POA: Diagnosis not present

## 2022-03-31 ENCOUNTER — Ambulatory Visit
Admission: RE | Admit: 2022-03-31 | Discharge: 2022-03-31 | Disposition: A | Discharge status: Home / Self Care | Payer: BC Managed Care – PPO | Source: Ambulatory Visit | Attending: Gynecology | Admitting: Gynecology

## 2022-03-31 DIAGNOSIS — Z1231 Encounter for screening mammogram for malignant neoplasm of breast: Secondary | ICD-10-CM | POA: Diagnosis present

## 2022-06-03 DIAGNOSIS — D225 Melanocytic nevi of trunk: Secondary | ICD-10-CM | POA: Diagnosis not present

## 2022-06-03 DIAGNOSIS — Z86018 Personal history of other benign neoplasm: Secondary | ICD-10-CM | POA: Diagnosis not present

## 2022-06-03 DIAGNOSIS — B009 Herpesviral infection, unspecified: Secondary | ICD-10-CM | POA: Diagnosis not present

## 2022-06-03 DIAGNOSIS — L814 Other melanin hyperpigmentation: Secondary | ICD-10-CM | POA: Diagnosis not present

## 2022-06-03 DIAGNOSIS — Z85820 Personal history of malignant melanoma of skin: Secondary | ICD-10-CM | POA: Diagnosis not present

## 2022-06-03 DIAGNOSIS — D485 Neoplasm of uncertain behavior of skin: Secondary | ICD-10-CM | POA: Diagnosis not present

## 2022-06-03 DIAGNOSIS — L578 Other skin changes due to chronic exposure to nonionizing radiation: Secondary | ICD-10-CM | POA: Diagnosis not present

## 2022-06-03 DIAGNOSIS — Z872 Personal history of diseases of the skin and subcutaneous tissue: Secondary | ICD-10-CM | POA: Diagnosis not present

## 2022-06-03 DIAGNOSIS — D2272 Melanocytic nevi of left lower limb, including hip: Secondary | ICD-10-CM | POA: Diagnosis not present

## 2022-06-03 DIAGNOSIS — Z859 Personal history of malignant neoplasm, unspecified: Secondary | ICD-10-CM | POA: Diagnosis not present

## 2022-07-15 DIAGNOSIS — D2372 Other benign neoplasm of skin of left lower limb, including hip: Secondary | ICD-10-CM | POA: Diagnosis not present

## 2022-07-15 DIAGNOSIS — L988 Other specified disorders of the skin and subcutaneous tissue: Secondary | ICD-10-CM | POA: Diagnosis not present

## 2022-09-22 DIAGNOSIS — Z833 Family history of diabetes mellitus: Secondary | ICD-10-CM | POA: Diagnosis not present

## 2022-09-22 DIAGNOSIS — Z809 Family history of malignant neoplasm, unspecified: Secondary | ICD-10-CM | POA: Diagnosis not present

## 2022-09-22 DIAGNOSIS — R030 Elevated blood-pressure reading, without diagnosis of hypertension: Secondary | ICD-10-CM | POA: Diagnosis not present

## 2022-09-22 DIAGNOSIS — Z85828 Personal history of other malignant neoplasm of skin: Secondary | ICD-10-CM | POA: Diagnosis not present

## 2022-09-22 DIAGNOSIS — Z8249 Family history of ischemic heart disease and other diseases of the circulatory system: Secondary | ICD-10-CM | POA: Diagnosis not present

## 2022-11-16 ENCOUNTER — Encounter: Payer: Self-pay | Admitting: Family Medicine

## 2022-11-16 ENCOUNTER — Ambulatory Visit: Payer: 59 | Admitting: Family Medicine

## 2022-11-16 VITALS — BP 140/100 | HR 72 | Ht 64.0 in | Wt 136.0 lb

## 2022-11-16 DIAGNOSIS — I10 Essential (primary) hypertension: Secondary | ICD-10-CM | POA: Diagnosis not present

## 2022-11-16 MED ORDER — LISINOPRIL 10 MG PO TABS: 10.0000 mg | ORAL_TABLET | Freq: Every day | ORAL | 0 refills | Status: DC

## 2022-11-16 NOTE — Patient Instructions (Signed)

## 2022-11-16 NOTE — Progress Notes (Signed)
Date:  11/16/2022   Name:  Hailey Rivera   DOB:  [birth year]   MRN:  [id number]   Chief Complaint: Hypertension (Bp going up and down. Was on Lisinopril approx 17 years ago. Has been off x 8 years.)  Hypertension This is a chronic problem. The current episode started more than 1 month ago. The problem has been gradually worsening since onset. The problem is uncontrolled. Associated symptoms include headaches and palpitations. Pertinent negatives include no anxiety, blurred vision, chest pain, malaise/fatigue, neck pain, orthopnea, peripheral edema, PND, shortness of breath or sweats. There are no associated agents to hypertension. Past treatments include ACE inhibitors (none for 5 years). There are no compliance problems.  There is no history of angina, CAD/MI or CVA.    Lab Results  Component Value Date   NA 142 08/10/2017   K 4.5 08/10/2017   CO2 26 08/10/2017   GLUCOSE 81 08/10/2017   BUN 15 06/30/2020   CREATININE 0.9 06/30/2020   CALCIUM 10.0 08/10/2017   GFRNONAA 97 08/10/2017   Lab Results  Component Value Date   CHOL 193 08/10/2017   HDL 67 08/10/2017   LDLCALC 115 (H) 08/10/2017   TRIG 55 08/10/2017   Lab Results  Component Value Date   TSH 1.80 06/30/2020   Lab Results  Component Value Date   HGBA1C 5.4 08/10/2017   Lab Results  Component Value Date   WBC 4.9 06/30/2020   HGB 15.4 06/30/2020   HCT 46 06/30/2020   PLT 272 06/30/2020   Lab Results  Component Value Date   ALT 15 08/10/2017   AST 23 08/10/2017   ALKPHOS 114 08/10/2017   BILITOT 0.3 08/10/2017   Lab Results  Component Value Date   VD25OH 64.3 06/30/2020     Review of Systems  Constitutional:  Negative for malaise/fatigue.  Eyes:  Negative for blurred vision.  Respiratory:  Negative for chest tightness, shortness of breath and wheezing.   Cardiovascular:  Positive for palpitations. Negative for chest pain, orthopnea, leg swelling and PND.  Gastrointestinal:  Negative for abdominal  pain.  Musculoskeletal:  Negative for neck pain.  Neurological:  Positive for headaches.    Patient Active Problem List   Diagnosis Date Noted   Osteoporosis of lumbar spine 08/27/2017   Osteopenia of femoral neck 08/27/2017   Hypercholesteremia 10/29/2015   Melanoma ([place]) 10/09/2015   Hypertension 10/09/2015   Glossitis 10/09/2015   Herpes simplex 10/09/2015   Rosacea 10/09/2015    No Known Allergies  Past Surgical History:  Procedure Laterality Date   AUGMENTATION MAMMAPLASTY Bilateral [place]   CKC of cervix   COLONOSCOPY  2011   internal hemorrhoids   COLONOSCOPY WITH PROPOFOL N/A 04/13/2021   Procedure: COLONOSCOPY WITH PROPOFOL;  Surgeon: Jonathon Bellows, MD;  Location: [facility] ENDOSCOPY;  Service: Gastroenterology;  Laterality: N/A;   TONSILLECTOMY  1971   WISDOM TOOTH EXTRACTION  age 21   age 47    Social History   Tobacco Use   Smoking status: Never   Smokeless tobacco: Never  Vaping Use   Vaping Use: Never used  Substance Use Topics   Alcohol use: Yes    Alcohol/week: 0.0 standard drinks of alcohol    Comment: on occasion   Drug use: No     Medication list has been reviewed and updated.  No outpatient medications have been marked as taking for the 11/16/22 encounter (Office Visit) with  Juline Patch, MD.       11/16/2022    8:05 AM 02/06/2021    2:30 PM  GAD 7 : Generalized Anxiety Score  Nervous, Anxious, on Edge 0 0  Control/stop worrying 0 0  Worry too much - different things 0 0  Trouble relaxing 0 0  Restless 0 0  Easily annoyed or irritable 0 0  Afraid - awful might happen 0 0  Total GAD 7 Score 0 0  Anxiety Difficulty Not difficult at all        11/16/2022    8:04 AM 02/06/2021    2:30 PM  Depression screen PHQ 2/9  Decreased Interest 0 0  Down, Depressed, Hopeless 0 0  PHQ - 2 Score 0 0  Altered sleeping 0 0  Tired, decreased energy 0 0  Change in appetite 0 0  Feeling bad or failure about  yourself  0 0  Trouble concentrating 0 0  Moving slowly or fidgety/restless 0 0  Suicidal thoughts 0 0  PHQ-9 Score 0 0  Difficult doing work/chores Not difficult at all     BP Readings from Last 3 Encounters:  11/16/22 (!) 140/100  04/13/21 (!) 124/99  03/09/21 110/72    Physical Exam Vitals and nursing note reviewed. Exam conducted with a chaperone present.  Constitutional:      General: She is not in acute distress.    Appearance: She is not diaphoretic.  HENT:     Head: Normocephalic and atraumatic.     Right Ear: External ear normal.     Left Ear: External ear normal.     Nose: Nose normal.  Eyes:     General:        Right eye: No discharge.        Left eye: No discharge.     Conjunctiva/sclera: Conjunctivae normal.     Pupils: Pupils are equal, round, and reactive to light.  Neck:     Thyroid: No thyromegaly.     Vascular: No JVD.  Cardiovascular:     Rate and Rhythm: Normal rate and regular rhythm.     Heart sounds: Normal heart sounds. No murmur heard.    No friction rub. No gallop.  Pulmonary:     Effort: Pulmonary effort is normal.     Breath sounds: Normal breath sounds.  Abdominal:     General: Bowel sounds are normal.     Palpations: Abdomen is soft. There is no mass.     Tenderness: There is no abdominal tenderness. There is no guarding.  Musculoskeletal:        General: Normal range of motion.     Cervical back: Normal range of motion and neck supple.  Lymphadenopathy:     Cervical: No cervical adenopathy.  Skin:    General: Skin is warm and dry.  Neurological:     Mental Status: She is alert.     Deep Tendon Reflexes: Reflexes are normal and symmetric.     Wt Readings from Last 3 Encounters:  11/16/22 136 lb (61.7 kg)  04/13/21 127 lb (57.6 kg)  03/09/21 131 lb 6.4 oz (59.6 kg)    BP (!) 140/100   Pulse 72   Ht 5' 4"$  (1.626 m)   Wt 136 lb (61.7 kg)   LMP 12/26/2013 (Approximate)   SpO2 97%   BMI 23.34 kg/m   Assessment and  Plan:  1. Primary hypertension Relatively new onset.  Recurrent from previous elevations over 5 years ago.  Recently with elevations of readings which are confirmed at today's visit.  Blood pressure today 140/100.  Previously was on lisinopril 10 mg and will resume lisinopril 10 mg once a day for 6 weeks we will recheck blood pressure with along with a lipid panel on next visit.  Patient is also being given Dash diet for supplemental therapeutic needs. - lisinopril (ZESTRIL) 10 MG tablet; Take 1 tablet (10 mg total) by mouth daily.  Dispense: 60 tablet; Refill: 0    Otilio Miu, MD

## 2022-12-07 DIAGNOSIS — D485 Neoplasm of uncertain behavior of skin: Secondary | ICD-10-CM | POA: Diagnosis not present

## 2022-12-07 DIAGNOSIS — L720 Epidermal cyst: Secondary | ICD-10-CM | POA: Diagnosis not present

## 2022-12-07 DIAGNOSIS — L814 Other melanin hyperpigmentation: Secondary | ICD-10-CM | POA: Diagnosis not present

## 2022-12-07 DIAGNOSIS — L578 Other skin changes due to chronic exposure to nonionizing radiation: Secondary | ICD-10-CM | POA: Diagnosis not present

## 2022-12-07 DIAGNOSIS — Z872 Personal history of diseases of the skin and subcutaneous tissue: Secondary | ICD-10-CM | POA: Diagnosis not present

## 2022-12-07 DIAGNOSIS — Z859 Personal history of malignant neoplasm, unspecified: Secondary | ICD-10-CM | POA: Diagnosis not present

## 2022-12-07 DIAGNOSIS — Z86018 Personal history of other benign neoplasm: Secondary | ICD-10-CM | POA: Diagnosis not present

## 2022-12-07 DIAGNOSIS — Z85820 Personal history of malignant melanoma of skin: Secondary | ICD-10-CM | POA: Diagnosis not present

## 2022-12-29 ENCOUNTER — Encounter: Payer: Self-pay | Admitting: Family Medicine

## 2022-12-29 ENCOUNTER — Ambulatory Visit: Payer: 59 | Admitting: Family Medicine

## 2022-12-29 VITALS — BP 104/64 | HR 70 | Ht 64.0 in | Wt 135.0 lb

## 2022-12-29 DIAGNOSIS — I10 Essential (primary) hypertension: Secondary | ICD-10-CM

## 2022-12-29 MED ORDER — LISINOPRIL 10 MG PO TABS: 10.0000 mg | ORAL_TABLET | Freq: Every day | ORAL | 1 refills | Status: DC

## 2022-12-29 NOTE — Progress Notes (Signed)
Date:  12/29/2022   Name:  Hailey Rivera   DOB:  10/27/1960   MRN:  [id number]   Chief Complaint: Hypertension (Started on bp med at last visit/)  Hypertension This is a chronic problem. The current episode started more than 1 year ago. The problem has been gradually improving since onset. The problem is controlled. Pertinent negatives include no chest pain, headaches, orthopnea, peripheral edema or shortness of breath. There are no associated agents to hypertension. Risk factors for coronary artery disease include dyslipidemia and diabetes mellitus. Past treatments include ACE inhibitors. The current treatment provides moderate improvement. There are no compliance problems.  There is no history of CAD/MI or CVA. There is no history of chronic renal disease, a hypertension causing med or renovascular disease.    Lab Results  Component Value Date   NA 142 08/10/2017   K 4.5 08/10/2017   CO2 26 08/10/2017   GLUCOSE 81 08/10/2017   BUN 15 06/30/2020   CREATININE 0.9 06/30/2020   CALCIUM 10.0 08/10/2017   GFRNONAA 97 08/10/2017   Lab Results  Component Value Date   CHOL 193 08/10/2017   HDL 67 08/10/2017   LDLCALC 115 (H) 08/10/2017   TRIG 55 08/10/2017   Lab Results  Component Value Date   TSH 1.80 06/30/2020   Lab Results  Component Value Date   HGBA1C 5.4 08/10/2017   Lab Results  Component Value Date   WBC 4.9 06/30/2020   HGB 15.4 06/30/2020   HCT 46 06/30/2020   PLT 272 06/30/2020   Lab Results  Component Value Date   ALT 15 08/10/2017   AST 23 08/10/2017   ALKPHOS 114 08/10/2017   BILITOT 0.3 08/10/2017   Lab Results  Component Value Date   VD25OH 64.3 06/30/2020     Review of Systems  Constitutional: Negative.  Negative for chills, fatigue, fever and unexpected weight change.  HENT:  Negative for congestion, ear discharge, ear pain, rhinorrhea, sinus pressure, sneezing and sore throat.   Respiratory:  Negative for cough, shortness of breath, wheezing  and stridor.   Cardiovascular:  Negative for chest pain and orthopnea.  Gastrointestinal:  Negative for abdominal pain, blood in stool, constipation, diarrhea and nausea.  Genitourinary:  Negative for dysuria, flank pain, frequency, hematuria, urgency and vaginal discharge.  Musculoskeletal:  Negative for arthralgias, back pain and myalgias.  Skin:  Negative for rash.  Neurological:  Negative for dizziness, weakness and headaches.  Hematological:  Negative for adenopathy. Does not bruise/bleed easily.  Psychiatric/Behavioral:  Negative for dysphoric mood. The patient is not nervous/anxious.     Patient Active Problem List   Diagnosis Date Noted   Osteoporosis of lumbar spine 08/27/2017   Osteopenia of femoral neck 08/27/2017   Hypercholesteremia 10/29/2015   Melanoma ([place]) 10/09/2015   Hypertension 10/09/2015   Glossitis 10/09/2015   Herpes simplex 10/09/2015   Rosacea 10/09/2015    No Known Allergies  Past Surgical History:  Procedure Laterality Date   AUGMENTATION MAMMAPLASTY Bilateral [place]   CKC of cervix   COLONOSCOPY  2011   internal hemorrhoids   COLONOSCOPY WITH PROPOFOL N/A 04/13/2021   Procedure: COLONOSCOPY WITH PROPOFOL;  Surgeon: Jonathon Bellows, MD;  Location: [facility] ENDOSCOPY;  Service: Gastroenterology;  Laterality: N/A;   TONSILLECTOMY  1971   WISDOM TOOTH EXTRACTION  age 39   age 13    Social History   Tobacco Use   Smoking status:  Never   Smokeless tobacco: Never  Vaping Use   Vaping Use: Never used  Substance Use Topics   Alcohol use: Yes    Alcohol/week: 0.0 standard drinks of alcohol    Comment: on occasion   Drug use: No     Medication list has been reviewed and updated.  Current Meds  Medication Sig   estradiol (VIVELLE-DOT) 0.0375 MG/24HR Place 1 patch onto the skin 2 (two) times a week.   lisinopril (ZESTRIL) 10 MG tablet Take 1 tablet (10 mg total) by mouth daily.   progesterone (PROMETRIUM) 200  MG capsule Take 1 capsule (200 mg total) by mouth daily.   tretinoin (RETIN-A) 0.1 % cream Apply topically at bedtime.   valACYclovir (VALTREX) 1000 MG tablet Take 2000 mgm q12hours x2 doses prn cold sores       12/29/2022    9:42 AM 11/16/2022    8:05 AM 02/06/2021    2:30 PM  GAD 7 : Generalized Anxiety Score  Nervous, Anxious, on Edge 0 0 0  Control/stop worrying 0 0 0  Worry too much - different things 0 0 0  Trouble relaxing 0 0 0  Restless 0 0 0  Easily annoyed or irritable 0 0 0  Afraid - awful might happen 0 0 0  Total GAD 7 Score 0 0 0  Anxiety Difficulty Not difficult at all Not difficult at all        12/29/2022    9:42 AM 11/16/2022    8:04 AM 02/06/2021    2:30 PM  Depression screen PHQ 2/9  Decreased Interest 0 0 0  Down, Depressed, Hopeless 0 0 0  PHQ - 2 Score 0 0 0  Altered sleeping 0 0 0  Tired, decreased energy 0 0 0  Change in appetite 0 0 0  Feeling bad or failure about yourself  0 0 0  Trouble concentrating 0 0 0  Moving slowly or fidgety/restless 0 0 0  Suicidal thoughts 0 0 0  PHQ-9 Score 0 0 0  Difficult doing work/chores Not difficult at all Not difficult at all     BP Readings from Last 3 Encounters:  12/29/22 104/64  11/16/22 (!) 140/100  04/13/21 (!) 124/99    Physical Exam Vitals and nursing note reviewed. Exam conducted with a chaperone present.  Constitutional:      General: She is not in acute distress.    Appearance: She is not diaphoretic.  HENT:     Head: Normocephalic and atraumatic.     Right Ear: Tympanic membrane and external ear normal.     Left Ear: Tympanic membrane and external ear normal.     Nose: Nose normal.     Mouth/Throat:     Mouth: Mucous membranes are moist.  Eyes:     General:        Right eye: No discharge.        Left eye: No discharge.     Conjunctiva/sclera: Conjunctivae normal.     Pupils: Pupils are equal, round, and reactive to light.  Neck:     Thyroid: No thyromegaly.     Vascular: No JVD.   Cardiovascular:     Rate and Rhythm: Normal rate and regular rhythm.     Heart sounds: Normal heart sounds. No murmur heard.    No friction rub. No gallop.  Pulmonary:     Effort: Pulmonary effort is normal.     Breath sounds: Normal breath sounds. No wheezing, rhonchi or rales.  Abdominal:     General: Bowel sounds are normal.     Palpations: Abdomen is soft. There is no mass.     Tenderness: There is no abdominal tenderness. There is no guarding.  Musculoskeletal:        General: Normal range of motion.     Cervical back: Normal range of motion and neck supple.  Lymphadenopathy:     Cervical: No cervical adenopathy.  Skin:    General: Skin is warm and dry.  Neurological:     Mental Status: She is alert.     Deep Tendon Reflexes: Reflexes are normal and symmetric.     Wt Readings from Last 3 Encounters:  12/29/22 135 lb (61.2 kg)  11/16/22 136 lb (61.7 kg)  04/13/21 127 lb (57.6 kg)    BP 104/64   Pulse 70   Ht 5\' 4"  (1.626 m)   Wt 135 lb (61.2 kg)   LMP 12/26/2013 (Approximate)   SpO2 98%   BMI 23.17 kg/m   Assessment and Plan:  1. Primary hypertension Chronic.  Controlled.  Stable.  Blood pressure today is 100/64.  Continue lisinopril 10 mg once a day.  Will check renal function panel when patient returns for preoperative clearance.  Will recheck for blood pressure in 6 months - lisinopril (ZESTRIL) 10 MG tablet; Take 1 tablet (10 mg total) by mouth daily.  Dispense: 90 tablet; Refill: 1    Otilio Miu, MD

## 2023-01-13 ENCOUNTER — Encounter: Payer: Self-pay | Admitting: Family Medicine

## 2023-01-13 ENCOUNTER — Ambulatory Visit: Payer: 59 | Admitting: Family Medicine

## 2023-01-13 VITALS — BP 114/78 | HR 72 | Ht 64.0 in | Wt 136.0 lb

## 2023-01-13 DIAGNOSIS — Z01818 Encounter for other preprocedural examination: Secondary | ICD-10-CM

## 2023-01-13 NOTE — Progress Notes (Signed)
Date:  01/13/2023   Name:  Hailey Rivera   DOB:  1960/11/05   MRN:  [id number]   Chief Complaint: Pre-op Exam (With labs- 5/28- Skin lift)  Patient is a 62 year old female who presents for a presurgical clearance  exam. The patient reports the following problems: none. Health maintenance has been reviewed up to date.      Lab Results  Component Value Date   NA 142 08/10/2017   K 4.5 08/10/2017   CO2 26 08/10/2017   GLUCOSE 81 08/10/2017   BUN 15 06/30/2020   CREATININE 0.9 06/30/2020   CALCIUM 10.0 08/10/2017   GFRNONAA 97 08/10/2017   Lab Results  Component Value Date   CHOL 193 08/10/2017   HDL 67 08/10/2017   LDLCALC 115 (H) 08/10/2017   TRIG 55 08/10/2017   Lab Results  Component Value Date   TSH 1.80 06/30/2020   Lab Results  Component Value Date   HGBA1C 5.4 08/10/2017   Lab Results  Component Value Date   WBC 4.9 06/30/2020   HGB 15.4 06/30/2020   HCT 46 06/30/2020   PLT 272 06/30/2020   Lab Results  Component Value Date   ALT 15 08/10/2017   AST 23 08/10/2017   ALKPHOS 114 08/10/2017   BILITOT 0.3 08/10/2017   Lab Results  Component Value Date   VD25OH 64.3 06/30/2020     Review of Systems  Constitutional:  Negative for chills, fatigue and fever.  HENT:  Negative for ear discharge, hearing loss, sinus pressure and trouble swallowing.   Eyes:  Negative for photophobia and visual disturbance.  Respiratory:  Negative for cough, choking, chest tightness, shortness of breath, wheezing and stridor.   Cardiovascular:  Negative for chest pain, palpitations and leg swelling.  Gastrointestinal:  Negative for abdominal pain, anal bleeding, blood in stool, constipation, diarrhea, nausea and vomiting.  Endocrine: Negative for cold intolerance, heat intolerance, polydipsia and polyuria.  Genitourinary:  Negative for difficulty urinating, flank pain, frequency, pelvic pain and vaginal bleeding.  Musculoskeletal:  Negative for arthralgias, back pain and  myalgias.  Skin:  Negative for color change.  Neurological:  Negative for facial asymmetry, weakness and numbness.  Hematological:  Negative for adenopathy. Does not bruise/bleed easily.    Patient Active Problem List   Diagnosis Date Noted   Osteoporosis of lumbar spine 08/27/2017   Osteopenia of femoral neck 08/27/2017   Hypercholesteremia 10/29/2015   Melanoma 10/09/2015   Hypertension 10/09/2015   Glossitis 10/09/2015   Herpes simplex 10/09/2015   Rosacea 10/09/2015    No Known Allergies  Past Surgical History:  Procedure Laterality Date   AUGMENTATION MAMMAPLASTY Bilateral 1981   CESAREAN SECTION  1991, 1993   CKC  1985   CKC of cervix   COLONOSCOPY  2011   internal hemorrhoids   COLONOSCOPY WITH PROPOFOL N/A 04/13/2021   Procedure: COLONOSCOPY WITH PROPOFOL;  Surgeon: Wyline Mood, MD;  Location: [facility] ENDOSCOPY;  Service: Gastroenterology;  Laterality: N/A;   TONSILLECTOMY  1971   WISDOM TOOTH EXTRACTION  age 66   age 32    Social History   Tobacco Use   Smoking status: Never   Smokeless tobacco: Never  Vaping Use   Vaping Use: Never used  Substance Use Topics   Alcohol use: Yes    Alcohol/week: 0.0 standard drinks of alcohol    Comment: on occasion   Drug use: No     Medication list has been reviewed and updated.  No outpatient  medications have been marked as taking for the 01/13/23 encounter (Office Visit) with Duanne LimerickJones, Amalya Salmons C, MD.       01/13/2023    9:20 AM 12/29/2022    9:42 AM 11/16/2022    8:05 AM 02/06/2021    2:30 PM  GAD 7 : Generalized Anxiety Score  Nervous, Anxious, on Edge 0 0 0 0  Control/stop worrying 0 0 0 0  Worry too much - different things 0 0 0 0  Trouble relaxing 0 0 0 0  Restless 0 0 0 0  Easily annoyed or irritable 0 0 0 0  Afraid - awful might happen 0 0 0 0  Total GAD 7 Score 0 0 0 0  Anxiety Difficulty Not difficult at all Not difficult at all Not difficult at all        01/13/2023    9:20 AM 12/29/2022    9:42 AM  11/16/2022    8:04 AM  Depression screen PHQ 2/9  Decreased Interest 0 0 0  Down, Depressed, Hopeless 0 0 0  PHQ - 2 Score 0 0 0  Altered sleeping 0 0 0  Tired, decreased energy 0 0 0  Change in appetite 0 0 0  Feeling bad or failure about yourself  0 0 0  Trouble concentrating 0 0 0  Moving slowly or fidgety/restless 0 0 0  Suicidal thoughts 0 0 0  PHQ-9 Score 0 0 0  Difficult doing work/chores Not difficult at all Not difficult at all Not difficult at all    BP Readings from Last 3 Encounters:  01/13/23 114/78  12/29/22 104/64  11/16/22 (!) 140/100    Physical Exam Vitals and nursing note reviewed. Exam conducted with a chaperone present.  Constitutional:      General: She is not in acute distress.    Appearance: She is not diaphoretic.  HENT:     Head: Normocephalic and atraumatic.     Right Ear: Tympanic membrane, ear canal and external ear normal.     Left Ear: Tympanic membrane, ear canal and external ear normal.     Nose: Nose normal.     Mouth/Throat:     Mouth: Mucous membranes are moist.  Eyes:     General:        Right eye: No discharge.        Left eye: No discharge.     Conjunctiva/sclera: Conjunctivae normal.     Pupils: Pupils are equal, round, and reactive to light.  Neck:     Thyroid: No thyromegaly.     Vascular: No JVD.  Cardiovascular:     Rate and Rhythm: Normal rate and regular rhythm.     Pulses: Normal pulses.          Carotid pulses are 2+ on the right side and 2+ on the left side.      Radial pulses are 2+ on the right side and 2+ on the left side.     Heart sounds: Normal heart sounds, S1 normal and S2 normal. No murmur heard.    No systolic murmur is present.     No diastolic murmur is present.     No friction rub. No gallop. No S3 or S4 sounds.  Pulmonary:     Effort: Pulmonary effort is normal.     Breath sounds: Normal breath sounds. No decreased air movement. No decreased breath sounds, wheezing, rhonchi or rales.  Abdominal:      General: Bowel sounds are normal.  Palpations: Abdomen is soft. There is no mass.     Tenderness: There is no abdominal tenderness. There is no guarding or rebound.  Musculoskeletal:        General: Normal range of motion.     Cervical back: Normal range of motion and neck supple.     Right lower leg: No edema.     Left lower leg: No edema.  Lymphadenopathy:     Cervical: No cervical adenopathy.  Skin:    General: Skin is warm and dry.     Capillary Refill: Capillary refill takes less than 2 seconds.  Neurological:     General: No focal deficit present.     Mental Status: She is alert.     Cranial Nerves: No cranial nerve deficit.     Motor: No weakness.     Deep Tendon Reflexes: Reflexes are normal and symmetric. Reflexes normal.     Wt Readings from Last 3 Encounters:  01/13/23 136 lb (61.7 kg)  12/29/22 135 lb (61.2 kg)  11/16/22 136 lb (61.7 kg)    BP 114/78   Pulse 72   Ht 5\' 4"  (1.626 m)   Wt 136 lb (61.7 kg)   LMP 12/26/2013 (Approximate)   SpO2 97%   BMI 23.34 kg/m   Assessment and Plan: 1. Preop examination Patient presents for a preop evaluation for elective surgery.  Review of present history is normal without concerns.  Past medical history without concerns and review of medications he is normal.  We will obtain EKG with the following results patient has sinus rhythm with a rate of 77 intervals are normal there is no voltage criteria met for LVH.  There is no ischemic changes such as Q waves, ST-T wave changes or delay in R wave progression.  EKG is read as normal.  We will obtain suggested lab work including PT PTT INR hemoglobin A1c CMP as well as CBC with differential platelets. - EKG 12-Lead - CBC w/Diff/Platelet - Comprehensive Metabolic Panel (CMET) - HgB A1c - INR/PT - PTT - TSH     Elizabeth Sauer, MD

## 2023-01-14 LAB — CBC WITH DIFFERENTIAL/PLATELET
Basophils Absolute: 0.1 10*3/uL (ref 0.0–0.2)
Basos: 1 %
EOS (ABSOLUTE): 0.3 10*3/uL (ref 0.0–0.4)
Eos: 7 %
Hematocrit: 46.3 % (ref 34.0–46.6)
Hemoglobin: 15.5 g/dL (ref 11.1–15.9)
Immature Grans (Abs): 0 10*3/uL (ref 0.0–0.1)
Immature Granulocytes: 0 %
Lymphocytes Absolute: 2.1 10*3/uL (ref 0.7–3.1)
Lymphs: 39 %
MCH: 31.9 pg (ref 26.6–33.0)
MCHC: 33.5 g/dL (ref 31.5–35.7)
MCV: 95 fL (ref 79–97)
Monocytes Absolute: 0.5 10*3/uL (ref 0.1–0.9)
Monocytes: 10 %
Neutrophils Absolute: 2.2 10*3/uL (ref 1.4–7.0)
Neutrophils: 43 %
Platelets: 297 10*3/uL (ref 150–450)
RBC: 4.86 x10E6/uL (ref 3.77–5.28)
RDW: 11.9 % (ref 11.7–15.4)
WBC: 5.2 10*3/uL (ref 3.4–10.8)

## 2023-01-14 LAB — COMPREHENSIVE METABOLIC PANEL
ALT: 18 IU/L (ref 0–32)
AST: 21 IU/L (ref 0–40)
Albumin/Globulin Ratio: 2.3 — ABNORMAL HIGH (ref 1.2–2.2)
Albumin: 4.4 g/dL (ref 3.9–4.9)
Alkaline Phosphatase: 87 IU/L (ref 44–121)
BUN/Creatinine Ratio: 13 (ref 12–28)
BUN: 12 mg/dL (ref 8–27)
Bilirubin Total: 0.3 mg/dL (ref 0.0–1.2)
CO2: 23 mmol/L (ref 20–29)
Calcium: 10.2 mg/dL (ref 8.7–10.3)
Chloride: 105 mmol/L (ref 96–106)
Creatinine, Ser: 0.89 mg/dL (ref 0.57–1.00)
Globulin, Total: 1.9 g/dL (ref 1.5–4.5)
Glucose: 98 mg/dL (ref 70–99)
Potassium: 4.9 mmol/L (ref 3.5–5.2)
Sodium: 141 mmol/L (ref 134–144)
Total Protein: 6.3 g/dL (ref 6.0–8.5)
eGFR: 73 mL/min/{1.73_m2} (ref >59)

## 2023-01-14 LAB — APTT: aPTT: 27 s (ref 24–33)

## 2023-01-14 LAB — HEMOGLOBIN A1C
Est. average glucose Bld gHb Est-mCnc: 114 mg/dL
Hgb A1c MFr Bld: 5.6 % (ref 4.8–5.6)

## 2023-01-14 LAB — PROTIME-INR
INR: 1 (ref 0.9–1.2)
Prothrombin Time: 10.5 s (ref 9.1–12.0)

## 2023-01-14 LAB — TSH: TSH: 2.27 u[IU]/mL (ref 0.450–4.500)

## 2023-02-09 DIAGNOSIS — R6882 Decreased libido: Secondary | ICD-10-CM | POA: Diagnosis not present

## 2023-02-21 DIAGNOSIS — N182 Chronic kidney disease, stage 2 (mild): Secondary | ICD-10-CM | POA: Diagnosis not present

## 2023-02-21 DIAGNOSIS — Z85828 Personal history of other malignant neoplasm of skin: Secondary | ICD-10-CM | POA: Diagnosis not present

## 2023-02-21 DIAGNOSIS — Z8249 Family history of ischemic heart disease and other diseases of the circulatory system: Secondary | ICD-10-CM | POA: Diagnosis not present

## 2023-02-21 DIAGNOSIS — Z807 Family history of other malignant neoplasms of lymphoid, hematopoietic and related tissues: Secondary | ICD-10-CM | POA: Diagnosis not present

## 2023-02-21 DIAGNOSIS — Z79890 Hormone replacement therapy (postmenopausal): Secondary | ICD-10-CM | POA: Diagnosis not present

## 2023-02-21 DIAGNOSIS — I129 Hypertensive chronic kidney disease with stage 1 through stage 4 chronic kidney disease, or unspecified chronic kidney disease: Secondary | ICD-10-CM | POA: Diagnosis not present

## 2023-04-08 IMAGING — MG DIGITAL SCREENING BREAST BILAT IMPLANT W/ TOMO W/ CAD
9 of 14 series · 9 of 34 positions shown · non-contrast
Comparison: Previous exam(s).

CLINICAL DATA: Screening.

EXAM:
DIGITAL SCREENING BILATERAL MAMMOGRAM WITH IMPLANTS, CAD AND
TOMOSYNTHESIS
TECHNIQUE: Bilateral screening digital craniocaudal and mediolateral oblique
mammograms were obtained. Bilateral screening digital breast
tomosynthesis was performed. The images were evaluated with
computer-aided detection. Standard and/or implant displaced views
were performed.

[L MLO]
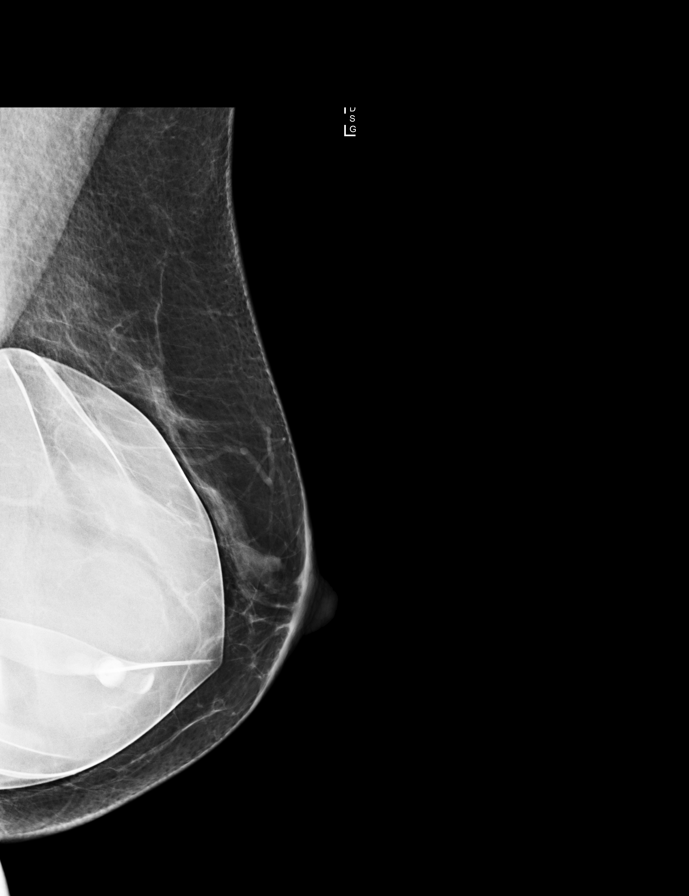

[R CC]
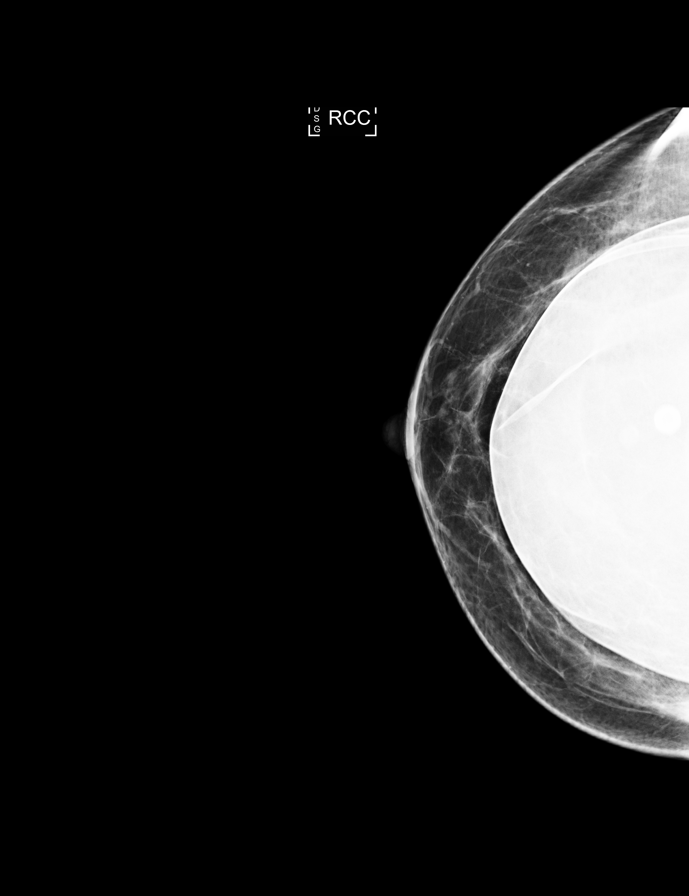

[L CC]
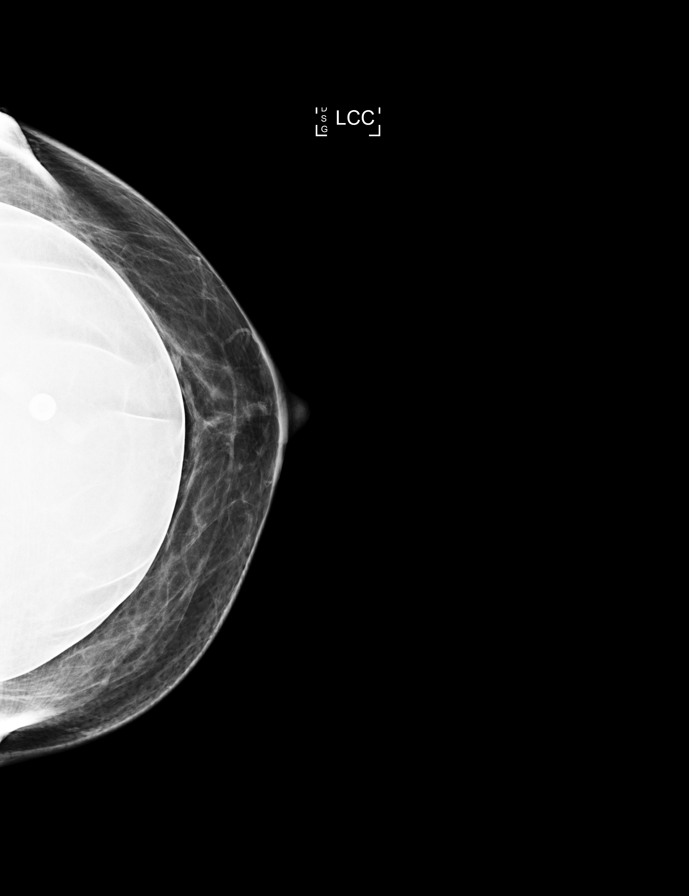

[R MLO]
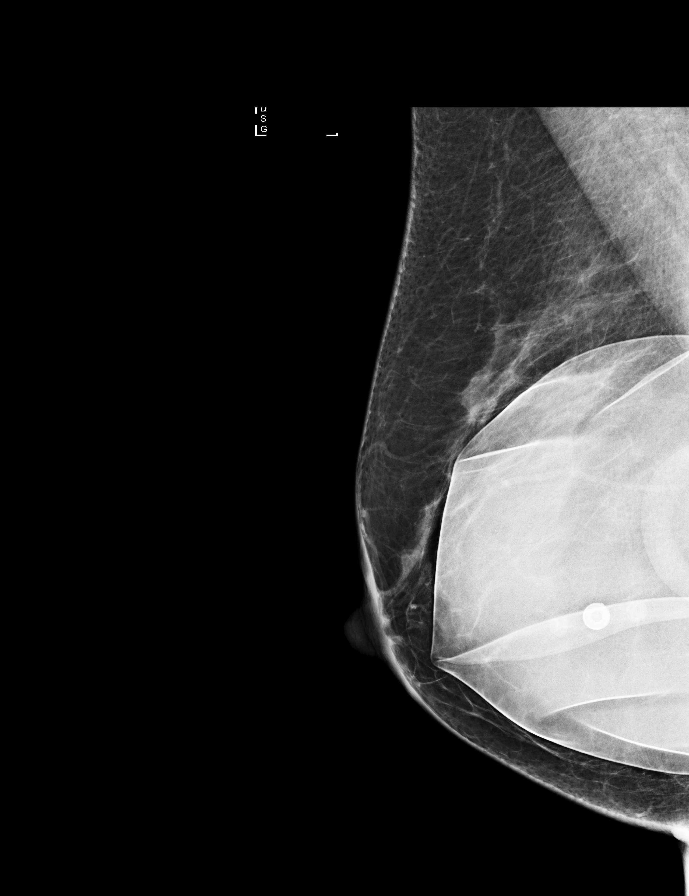

[L CC synth-2D]
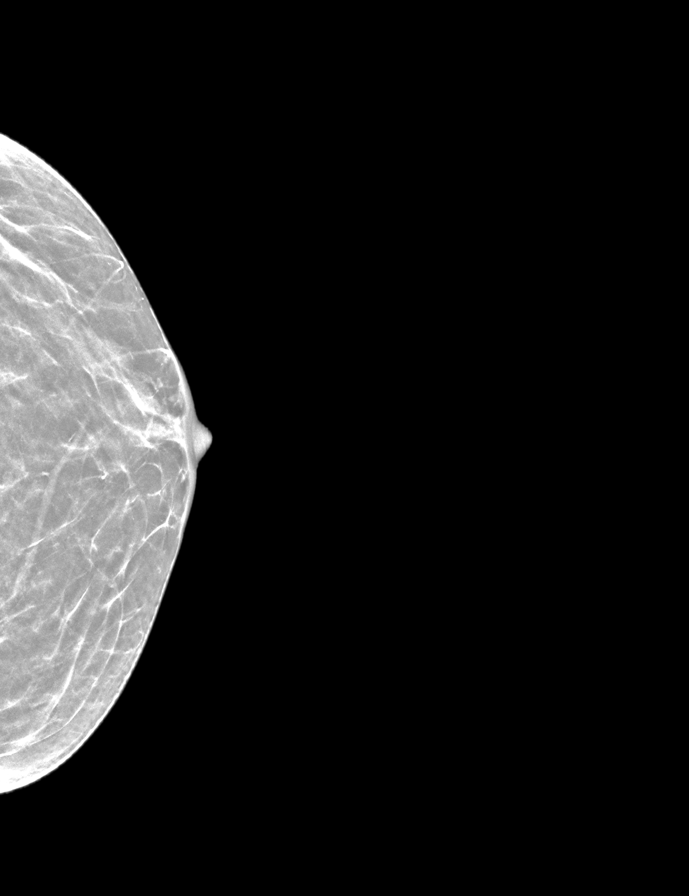

[L MLO synth-2D]
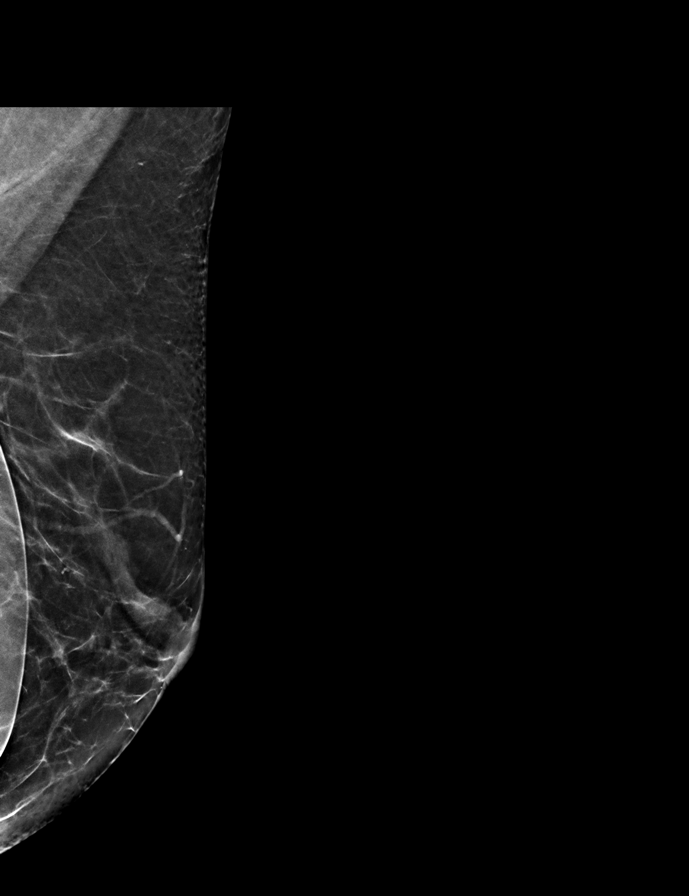

[R CC synth-2D]
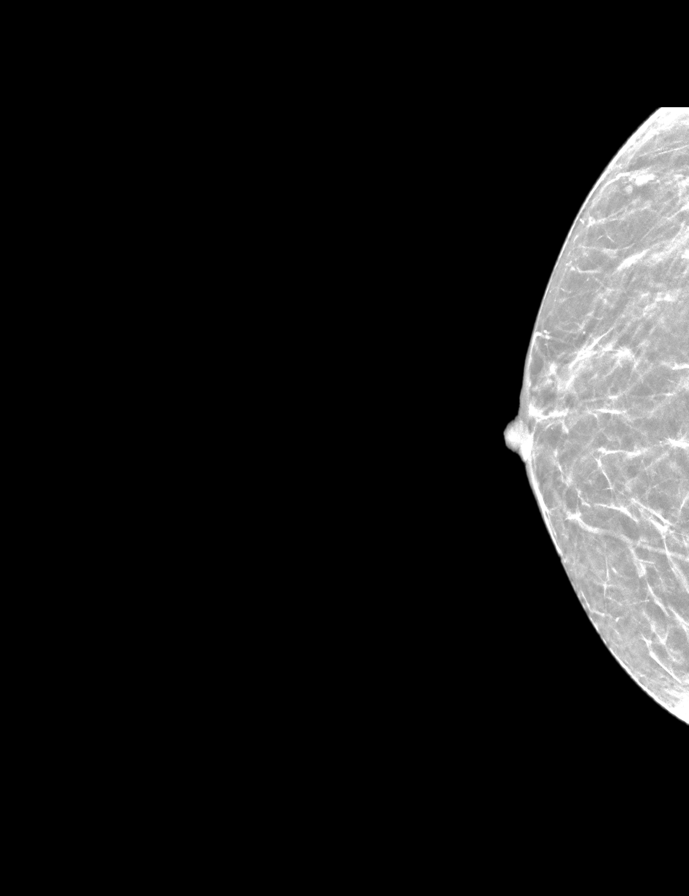

[R MLO synth-2D (1 of 2)]
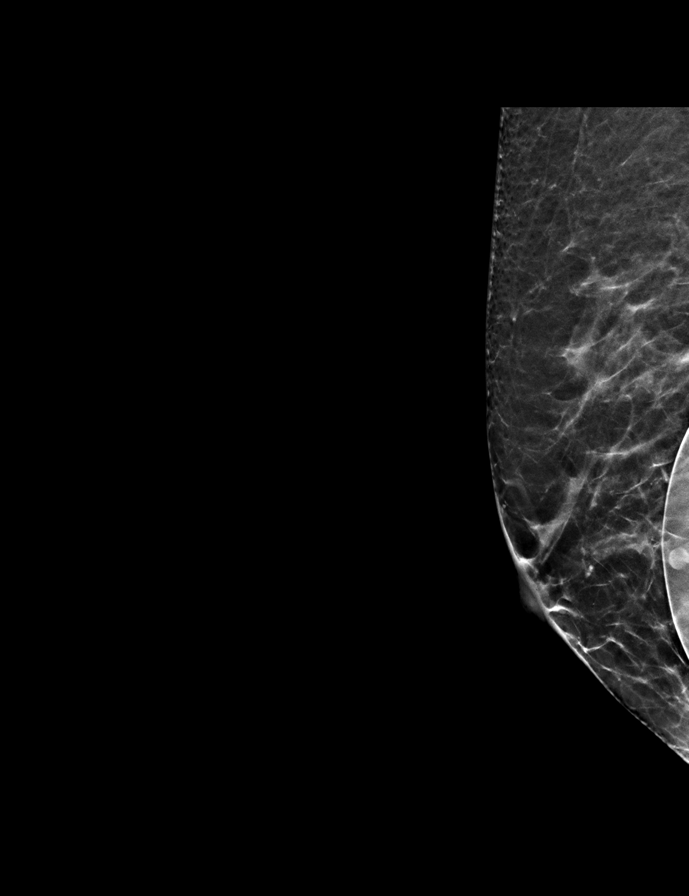

[R MLO synth-2D (2 of 2)]
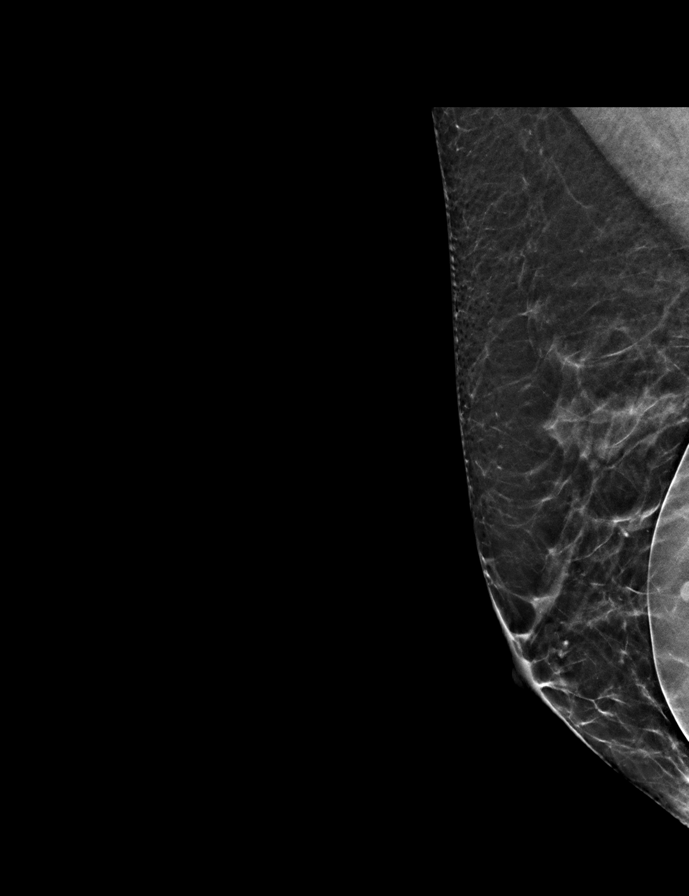

[9 of 34 positions shown; findings below may reference images not displayed]

ACR Breast Density Category b: There are scattered areas of
fibroglandular density.
FINDINGS: The patient has retropectoral implants. There are no findings
suspicious for malignancy.
IMPRESSION: No mammographic evidence of malignancy. A result letter of this
screening mammogram will be mailed directly to the patient.

RECOMMENDATION:
Screening mammogram in one year. (Code:SE-S-JMG)

BI-RADS CATEGORY  1:  Negative.

## 2023-06-22 ENCOUNTER — Other Ambulatory Visit: Payer: Self-pay | Admitting: Family Medicine

## 2023-06-22 DIAGNOSIS — I10 Essential (primary) hypertension: Secondary | ICD-10-CM

## 2023-07-01 ENCOUNTER — Ambulatory Visit: Payer: 59 | Admitting: Family Medicine

## 2023-07-12 ENCOUNTER — Encounter: Payer: Self-pay | Admitting: Family Medicine

## 2023-07-12 ENCOUNTER — Ambulatory Visit: Payer: 59 | Admitting: Family Medicine

## 2023-07-12 VITALS — BP 120/76 | HR 64 | Ht 64.0 in | Wt 134.0 lb

## 2023-07-12 DIAGNOSIS — I10 Essential (primary) hypertension: Secondary | ICD-10-CM

## 2023-07-12 DIAGNOSIS — R5383 Other fatigue: Secondary | ICD-10-CM | POA: Diagnosis not present

## 2023-07-12 DIAGNOSIS — E785 Hyperlipidemia, unspecified: Secondary | ICD-10-CM

## 2023-07-12 DIAGNOSIS — B009 Herpesviral infection, unspecified: Secondary | ICD-10-CM | POA: Diagnosis not present

## 2023-07-12 MED ORDER — VALACYCLOVIR HCL 1 G PO TABS: ORAL_TABLET | ORAL | 5 refills | Status: AC

## 2023-07-12 MED ORDER — LISINOPRIL 10 MG PO TABS: 10.0000 mg | ORAL_TABLET | Freq: Every day | ORAL | 1 refills | Status: DC

## 2023-07-12 NOTE — Progress Notes (Signed)
Date:  07/12/2023   Name:  Hailey Rivera   DOB:  May 01, 1961   MRN:  [id number]   Chief Complaint: Hypertension  Hypertension This is a chronic problem. The problem has been gradually improving since onset. The problem is controlled. Pertinent negatives include no anxiety, blurred vision, chest pain, headaches, malaise/fatigue, neck pain, orthopnea, palpitations, peripheral edema, PND, shortness of breath or sweats. There are no associated agents to hypertension. Risk factors for coronary artery disease include diabetes mellitus and dyslipidemia.    Lab Results  Component Value Date   NA 141 01/13/2023   K 4.9 01/13/2023   CO2 23 01/13/2023   GLUCOSE 98 01/13/2023   BUN 12 01/13/2023   CREATININE 0.89 01/13/2023   CALCIUM 10.2 01/13/2023   EGFR 73 01/13/2023   GFRNONAA 97 08/10/2017   Lab Results  Component Value Date   CHOL 193 08/10/2017   HDL 67 08/10/2017   LDLCALC 115 (H) 08/10/2017   TRIG 55 08/10/2017   Lab Results  Component Value Date   TSH 2.270 01/13/2023   Lab Results  Component Value Date   HGBA1C 5.6 01/13/2023   Lab Results  Component Value Date   WBC 5.2 01/13/2023   HGB 15.5 01/13/2023   HCT 46.3 01/13/2023   MCV 95 01/13/2023   PLT 297 01/13/2023   Lab Results  Component Value Date   ALT 18 01/13/2023   AST 21 01/13/2023   ALKPHOS 87 01/13/2023   BILITOT 0.3 01/13/2023   Lab Results  Component Value Date   VD25OH 64.3 06/30/2020     Review of Systems  Constitutional:  Negative for fatigue, malaise/fatigue and unexpected weight change.  HENT:  Negative for congestion and trouble swallowing.   Eyes:  Negative for blurred vision.  Respiratory:  Negative for choking, chest tightness, shortness of breath, wheezing and stridor.   Cardiovascular:  Negative for chest pain, palpitations, orthopnea and PND.  Gastrointestinal:  Negative for abdominal distention, abdominal pain and blood in stool.  Endocrine: Negative for polydipsia and  polyuria.  Genitourinary:  Negative for hematuria and menstrual problem.  Musculoskeletal:  Negative for myalgias and neck pain.  Neurological:  Negative for headaches.    Patient Active Problem List   Diagnosis Date Noted   Osteoporosis of lumbar spine 08/27/2017   Osteopenia of femoral neck 08/27/2017   Hypercholesteremia 10/29/2015   Melanoma ([facility]) 10/09/2015   Hypertension 10/09/2015   Glossitis 10/09/2015   Herpes simplex 10/09/2015   Rosacea 10/09/2015    No Known Allergies  Past Surgical History:  Procedure Laterality Date   AUGMENTATION MAMMAPLASTY Bilateral 1981   CESAREAN SECTION  1991, 1993   CKC  1985   CKC of cervix   COLONOSCOPY  2011   internal hemorrhoids   COLONOSCOPY WITH PROPOFOL N/A 04/13/2021   Procedure: COLONOSCOPY WITH PROPOFOL;  Surgeon: Wyline Mood, MD;  Location: [facility] ENDOSCOPY;  Service: Gastroenterology;  Laterality: N/A;   TONSILLECTOMY  1971   WISDOM TOOTH EXTRACTION  age 7   age 89    Social History   Tobacco Use   Smoking status: Never   Smokeless tobacco: Never  Vaping Use   Vaping status: Never Used  Substance Use Topics   Alcohol use: Yes    Alcohol/week: 0.0 standard drinks of alcohol    Comment: on occasion   Drug use: No     Medication list has been reviewed and updated.  Current Meds  Medication Sig   estradiol (VIVELLE-DOT) 0.0375 MG/24HR Place  1 patch onto the skin 2 (two) times a week.   lisinopril (ZESTRIL) 10 MG tablet TAKE 1 TABLET BY MOUTH EVERY DAY   progesterone (PROMETRIUM) 200 MG capsule Take 1 capsule (200 mg total) by mouth daily.   tretinoin (RETIN-A) 0.1 % cream Apply topically at bedtime.   valACYclovir (VALTREX) 1000 MG tablet Take 2000 mgm q12hours x2 doses prn cold sores       01/13/2023    9:20 AM 12/29/2022    9:42 AM 11/16/2022    8:05 AM 02/06/2021    2:30 PM  GAD 7 : Generalized Anxiety Score  Nervous, Anxious, on Edge 0 0 0 0  Control/stop worrying 0 0 0 0  Worry too much - different  things 0 0 0 0  Trouble relaxing 0 0 0 0  Restless 0 0 0 0  Easily annoyed or irritable 0 0 0 0  Afraid - awful might happen 0 0 0 0  Total GAD 7 Score 0 0 0 0  Anxiety Difficulty Not difficult at all Not difficult at all Not difficult at all        01/13/2023    9:20 AM 12/29/2022    9:42 AM 11/16/2022    8:04 AM  Depression screen PHQ 2/9  Decreased Interest 0 0 0  Down, Depressed, Hopeless 0 0 0  PHQ - 2 Score 0 0 0  Altered sleeping 0 0 0  Tired, decreased energy 0 0 0  Change in appetite 0 0 0  Feeling bad or failure about yourself  0 0 0  Trouble concentrating 0 0 0  Moving slowly or fidgety/restless 0 0 0  Suicidal thoughts 0 0 0  PHQ-9 Score 0 0 0  Difficult doing work/chores Not difficult at all Not difficult at all Not difficult at all    BP Readings from Last 3 Encounters:  07/12/23 120/76  01/13/23 114/78  12/29/22 104/64    Physical Exam Vitals and nursing note reviewed. Exam conducted with a chaperone present.  Constitutional:      General: She is not in acute distress.    Appearance: She is not diaphoretic.  HENT:     Head: Normocephalic and atraumatic.     Right Ear: Tympanic membrane and external ear normal.     Left Ear: Tympanic membrane and external ear normal.     Nose: Nose normal. No congestion or rhinorrhea.     Mouth/Throat:     Pharynx: No oropharyngeal exudate or posterior oropharyngeal erythema.  Eyes:     General:        Right eye: No discharge.        Left eye: No discharge.     Conjunctiva/sclera: Conjunctivae normal.     Pupils: Pupils are equal, round, and reactive to light.  Neck:     Thyroid: No thyromegaly.     Vascular: No JVD.  Cardiovascular:     Rate and Rhythm: Normal rate and regular rhythm.     Heart sounds: Normal heart sounds. No murmur heard.    No friction rub. No gallop.  Pulmonary:     Effort: Pulmonary effort is normal.     Breath sounds: Normal breath sounds. No wheezing, rhonchi or rales.  Abdominal:      General: Bowel sounds are normal.     Palpations: Abdomen is soft. There is no mass.     Tenderness: There is no abdominal tenderness. There is no guarding.  Musculoskeletal:  General: Normal range of motion.     Cervical back: Normal range of motion and neck supple.  Lymphadenopathy:     Cervical: No cervical adenopathy.  Skin:    General: Skin is warm and dry.  Neurological:     Mental Status: She is alert.     Deep Tendon Reflexes: Reflexes are normal and symmetric.     Wt Readings from Last 3 Encounters:  07/12/23 134 lb (60.8 kg)  01/13/23 136 lb (61.7 kg)  12/29/22 135 lb (61.2 kg)    BP 120/76   Pulse 64   Ht 5\' 4"  (1.626 m)   Wt 134 lb (60.8 kg)   LMP 12/26/2013 (Approximate)   SpO2 98%   BMI 23.00 kg/m   Assessment and Plan: 1. Primary hypertension Chronic.  Controlled.  Stable.  Blood pressure today is 120/76.  Asymptomatic.  Tolerating well medications well.  Continue lisinopril 10 mg once a day.  Will recheck in 6 months. - lisinopril (ZESTRIL) 10 MG tablet; Take 1 tablet (10 mg total) by mouth daily.  Dispense: 90 tablet; Refill: 1  2. Herpes simplex Chronic.  Episodic.  Stable.  Refill valacyclovir as needed basis. - valACYclovir (VALTREX) 1000 MG tablet; Take 2000 mgm q12hours x2 doses prn cold sores  Dispense: 30 tablet; Refill: 5  3. Fatigue, unspecified type New onset.  Patient has had some fatigue without weight concerns.  We will check TSH for possible emergence of hypothyroid state. - TSH  4. Dyslipidemia Elevated lipids in the past particularly LDL.  Will check lipid panel and patient has been given low-cholesterol low triglyceride dietary guidelines. - Lipid Panel With LDL/HDL Ratio     Elizabeth Sauer, MD

## 2023-07-13 ENCOUNTER — Other Ambulatory Visit: Payer: Self-pay

## 2023-07-13 DIAGNOSIS — I10 Essential (primary) hypertension: Secondary | ICD-10-CM

## 2023-07-13 LAB — LIPID PANEL WITH LDL/HDL RATIO
Cholesterol, Total: 240 mg/dL — ABNORMAL HIGH (ref 100–199)
HDL: 49 mg/dL (ref >39)
LDL Chol Calc (NIH): 172 mg/dL — ABNORMAL HIGH (ref 0–99)
LDL/HDL Ratio: 3.5 {ratio} — ABNORMAL HIGH (ref 0.0–3.2)
Triglycerides: 107 mg/dL (ref 0–149)
VLDL Cholesterol Cal: 19 mg/dL (ref 5–40)

## 2023-07-13 LAB — TSH: TSH: 1.64 u[IU]/mL (ref 0.450–4.500)

## 2023-07-13 NOTE — Patient Instructions (Signed)

## 2023-07-13 NOTE — Progress Notes (Signed)
Orders placed pt notified of results, voiced [id number]

## 2023-08-04 ENCOUNTER — Ambulatory Visit: Payer: BC Managed Care – PPO | Admitting: Nurse Practitioner

## 2023-08-08 ENCOUNTER — Encounter: Payer: Self-pay | Admitting: Gynecology

## 2023-08-09 ENCOUNTER — Other Ambulatory Visit: Payer: Self-pay | Admitting: Gynecology

## 2023-08-09 DIAGNOSIS — Z1231 Encounter for screening mammogram for malignant neoplasm of breast: Secondary | ICD-10-CM

## 2023-08-18 ENCOUNTER — Ambulatory Visit
Admission: RE | Admit: 2023-08-18 | Discharge: 2023-08-18 | Disposition: A | Discharge status: Home / Self Care | Payer: 59 | Source: Ambulatory Visit | Attending: Gynecology | Admitting: Gynecology

## 2023-08-18 DIAGNOSIS — Z1231 Encounter for screening mammogram for malignant neoplasm of breast: Secondary | ICD-10-CM | POA: Insufficient documentation

## 2023-09-12 ENCOUNTER — Other Ambulatory Visit: Payer: Self-pay

## 2023-09-12 DIAGNOSIS — E785 Hyperlipidemia, unspecified: Secondary | ICD-10-CM | POA: Diagnosis not present

## 2023-09-13 ENCOUNTER — Encounter: Payer: Self-pay | Admitting: Family Medicine

## 2023-09-13 LAB — LIPID PANEL
Chol/HDL Ratio: 3.8 {ratio} (ref 0.0–4.4)
Cholesterol, Total: 180 mg/dL (ref 100–199)
HDL: 48 mg/dL (ref >39)
LDL Chol Calc (NIH): 118 mg/dL — ABNORMAL HIGH (ref 0–99)
Triglycerides: 76 mg/dL (ref 0–149)
VLDL Cholesterol Cal: 14 mg/dL (ref 5–40)

## 2023-12-07 DIAGNOSIS — Z872 Personal history of diseases of the skin and subcutaneous tissue: Secondary | ICD-10-CM | POA: Diagnosis not present

## 2023-12-07 DIAGNOSIS — L578 Other skin changes due to chronic exposure to nonionizing radiation: Secondary | ICD-10-CM | POA: Diagnosis not present

## 2023-12-07 DIAGNOSIS — Z86018 Personal history of other benign neoplasm: Secondary | ICD-10-CM | POA: Diagnosis not present

## 2023-12-07 DIAGNOSIS — Z85820 Personal history of malignant melanoma of skin: Secondary | ICD-10-CM | POA: Diagnosis not present

## 2023-12-07 DIAGNOSIS — L814 Other melanin hyperpigmentation: Secondary | ICD-10-CM | POA: Diagnosis not present

## 2023-12-07 DIAGNOSIS — D485 Neoplasm of uncertain behavior of skin: Secondary | ICD-10-CM | POA: Diagnosis not present

## 2023-12-07 DIAGNOSIS — Z859 Personal history of malignant neoplasm, unspecified: Secondary | ICD-10-CM | POA: Diagnosis not present

## 2023-12-22 ENCOUNTER — Ambulatory Visit (INDEPENDENT_AMBULATORY_CARE_PROVIDER_SITE_OTHER): Payer: 59 | Admitting: Family Medicine

## 2023-12-22 ENCOUNTER — Encounter: Payer: Self-pay | Admitting: Family Medicine

## 2023-12-22 VITALS — BP 104/76 | HR 73 | Ht 64.0 in | Wt 133.0 lb

## 2023-12-22 DIAGNOSIS — I10 Essential (primary) hypertension: Secondary | ICD-10-CM

## 2023-12-22 DIAGNOSIS — E785 Hyperlipidemia, unspecified: Secondary | ICD-10-CM | POA: Diagnosis not present

## 2023-12-22 MED ORDER — LISINOPRIL 10 MG PO TABS: 10.0000 mg | ORAL_TABLET | Freq: Every day | ORAL | 1 refills | Status: AC

## 2023-12-22 NOTE — Progress Notes (Signed)
 Date:  12/22/2023   Name:  Hailey Rivera   DOB:  Jun 22, 1961   MRN:  [id number]   Chief Complaint: Hypertension  Hypertension This is a chronic problem. The current episode started more than 1 year ago. The problem has been gradually improving since onset. The problem is controlled. Pertinent negatives include no anxiety, blurred vision, chest pain, headaches, malaise/fatigue, neck pain, orthopnea, palpitations, peripheral edema, PND, shortness of breath or sweats. There are no associated agents to hypertension. Risk factors for coronary artery disease include dyslipidemia. Past treatments include ACE inhibitors. The current treatment provides moderate improvement. There are no compliance problems.  There is no history of CAD/MI or CVA. There is no history of chronic renal disease, a hypertension causing med or renovascular disease.    Lab Results  Component Value Date   NA 141 01/13/2023   K 4.9 01/13/2023   CO2 23 01/13/2023   GLUCOSE 98 01/13/2023   BUN 12 01/13/2023   CREATININE 0.89 01/13/2023   CALCIUM 10.2 01/13/2023   EGFR 73 01/13/2023   GFRNONAA 97 08/10/2017   Lab Results  Component Value Date   CHOL 180 09/12/2023   HDL 48 09/12/2023   LDLCALC 118 (H) 09/12/2023   TRIG 76 09/12/2023   CHOLHDL 3.8 09/12/2023   Lab Results  Component Value Date   TSH 1.640 07/12/2023   Lab Results  Component Value Date   HGBA1C 5.6 01/13/2023   Lab Results  Component Value Date   WBC 5.2 01/13/2023   HGB 15.5 01/13/2023   HCT 46.3 01/13/2023   MCV 95 01/13/2023   PLT 297 01/13/2023   Lab Results  Component Value Date   ALT 18 01/13/2023   AST 21 01/13/2023   ALKPHOS 87 01/13/2023   BILITOT 0.3 01/13/2023   Lab Results  Component Value Date   VD25OH 64.3 06/30/2020     Review of Systems  Constitutional: Negative.  Negative for chills, fatigue, fever, malaise/fatigue and unexpected weight change.  HENT:  Negative for congestion, ear discharge, ear pain and  rhinorrhea.   Eyes:  Negative for blurred vision.  Respiratory:  Negative for cough, shortness of breath, wheezing and stridor.   Cardiovascular:  Negative for chest pain, palpitations, orthopnea and PND.  Gastrointestinal:  Negative for abdominal pain, blood in stool, constipation, diarrhea and nausea.  Genitourinary:  Negative for difficulty urinating, hematuria, urgency, vaginal bleeding and vaginal discharge.  Musculoskeletal:  Negative for arthralgias, back pain, myalgias and neck pain.  Skin:  Negative for rash.  Neurological:  Negative for dizziness, weakness and headaches.  Hematological:  Negative for adenopathy. Does not bruise/bleed easily.  Psychiatric/Behavioral:  Negative for dysphoric mood. The patient is not nervous/anxious.     Patient Active Problem List   Diagnosis Date Noted   Osteoporosis of lumbar spine 08/27/2017   Osteopenia of femoral neck 08/27/2017   Hypercholesteremia 10/29/2015   Melanoma (HCC) 10/09/2015   Hypertension 10/09/2015   Herpes simplex 10/09/2015   Rosacea 10/09/2015    No Known Allergies  Past Surgical History:  Procedure Laterality Date   AUGMENTATION MAMMAPLASTY Bilateral 1981   CESAREAN SECTION  1991, 1993   CKC  1985   CKC of cervix   COLONOSCOPY  2011   internal hemorrhoids   COLONOSCOPY WITH PROPOFOL N/A 04/13/2021   Procedure: COLONOSCOPY WITH PROPOFOL;  Surgeon: Wyline Mood, MD;  Location: [facility] ENDOSCOPY;  Service: Gastroenterology;  Laterality: N/A;   TONSILLECTOMY  1971   WISDOM TOOTH EXTRACTION  age 75  age 58    Social History   Tobacco Use   Smoking status: Never   Smokeless tobacco: Never  Vaping Use   Vaping status: Never Used  Substance Use Topics   Alcohol use: Yes    Alcohol/week: 0.0 standard drinks of alcohol    Comment: on occasion   Drug use: No     Medication list has been reviewed and updated.  Current Meds  Medication Sig   estradiol (VIVELLE-DOT) 0.0375 MG/24HR Place 1 patch onto the skin 2  (two) times a week.   lisinopril (ZESTRIL) 10 MG tablet Take 1 tablet (10 mg total) by mouth daily.   progesterone (PROMETRIUM) 200 MG capsule Take 1 capsule (200 mg total) by mouth daily.   tretinoin (RETIN-A) 0.1 % cream Apply topically at bedtime.   valACYclovir (VALTREX) 1000 MG tablet Take 2000 mgm q12hours x2 doses prn cold sores       12/22/2023   10:23 AM 01/13/2023    9:20 AM 12/29/2022    9:42 AM 11/16/2022    8:05 AM  GAD 7 : Generalized Anxiety Score  Nervous, Anxious, on Edge 0 0 0 0  Control/stop worrying 0 0 0 0  Worry too much - different things 0 0 0 0  Trouble relaxing 0 0 0 0  Restless 0 0 0 0  Easily annoyed or irritable 0 0 0 0  Afraid - awful might happen 0 0 0 0  Total GAD 7 Score 0 0 0 0  Anxiety Difficulty Not difficult at all Not difficult at all Not difficult at all Not difficult at all       12/22/2023   10:23 AM 01/13/2023    9:20 AM 12/29/2022    9:42 AM  Depression screen PHQ 2/9  Decreased Interest 0 0 0  Down, Depressed, Hopeless 0 0 0  PHQ - 2 Score 0 0 0  Altered sleeping  0 0  Tired, decreased energy  0 0  Change in appetite  0 0  Feeling bad or failure about yourself   0 0  Trouble concentrating  0 0  Moving slowly or fidgety/restless  0 0  Suicidal thoughts  0 0  PHQ-9 Score  0 0  Difficult doing work/chores  Not difficult at all Not difficult at all    BP Readings from Last 3 Encounters:  12/22/23 104/76  07/12/23 120/76  01/13/23 114/78    Physical Exam Vitals reviewed.  Constitutional:      General: She is not in acute distress.    Appearance: She is not diaphoretic.  HENT:     Head: Normocephalic and atraumatic.     Right Ear: Tympanic membrane and external ear normal.     Left Ear: Tympanic membrane and external ear normal.     Nose: Nose normal.     Mouth/Throat:     Mouth: Mucous membranes are moist.  Eyes:     General:        Right eye: No discharge.        Left eye: No discharge.     Conjunctiva/sclera:  Conjunctivae normal.     Pupils: Pupils are equal, round, and reactive to light.  Neck:     Thyroid: No thyromegaly.     Vascular: No JVD.  Cardiovascular:     Rate and Rhythm: Normal rate and regular rhythm.     Heart sounds: Normal heart sounds and S1 normal. No murmur heard.    No systolic murmur is present.  No diastolic murmur is present.     No friction rub. No gallop. No S3 or S4 sounds.  Pulmonary:     Effort: Pulmonary effort is normal.     Breath sounds: Normal breath sounds. No wheezing, rhonchi or rales.  Abdominal:     General: Bowel sounds are normal.     Palpations: Abdomen is soft. There is no hepatomegaly, splenomegaly or mass.     Tenderness: There is no abdominal tenderness. There is no guarding.  Musculoskeletal:        General: Normal range of motion.     Cervical back: Normal range of motion and neck supple.     Right lower leg: No edema.     Left lower leg: No edema.  Lymphadenopathy:     Cervical: No cervical adenopathy.  Skin:    General: Skin is warm and dry.  Neurological:     Mental Status: She is alert.     Deep Tendon Reflexes: Reflexes are normal and symmetric.     Wt Readings from Last 3 Encounters:  12/22/23 133 lb (60.3 kg)  07/12/23 134 lb (60.8 kg)  01/13/23 136 lb (61.7 kg)    BP 104/76   Pulse 73   Ht 5\' 4"  (1.626 m)   Wt 133 lb (60.3 kg)   LMP 12/26/2013 (Approximate)   SpO2 98%   BMI 22.83 kg/m   Assessment and Plan: 1. Primary hypertension Chronic.  Controlled.  Stable.  Blood pressure today is 104/76.  Asymptomatic.  Tolerating medications well.  Continue lisinopril 10 mg once a day.  Will hold on CMP till patient secures new physicia and obtains labs of physicians choice. - lisinopril (ZESTRIL) 10 MG tablet; Take 1 tablet (10 mg total) by mouth daily.  Dispense: 90 tablet; Refill: 1 - Comprehensive metabolic panel  2. Dyslipidemia (Primary) Chronic.  Diet controlled.  Stable.  Continue with current regimen and will  recheck on as-needed basis. - Comprehensive metabolic panel     Elizabeth Sauer, MD

## 2023-12-22 NOTE — Patient Instructions (Signed)

## 2024-02-07 DIAGNOSIS — R6882 Decreased libido: Secondary | ICD-10-CM | POA: Diagnosis not present

## 2024-07-27 ENCOUNTER — Other Ambulatory Visit: Payer: Self-pay | Admitting: Family Medicine

## 2024-07-27 DIAGNOSIS — M810 Age-related osteoporosis without current pathological fracture: Secondary | ICD-10-CM

## 2024-07-27 DIAGNOSIS — Z1231 Encounter for screening mammogram for malignant neoplasm of breast: Secondary | ICD-10-CM

## 2024-09-18 ENCOUNTER — Ambulatory Visit
Admission: RE | Admit: 2024-09-18 | Discharge: 2024-09-18 | Disposition: A | Source: Ambulatory Visit | Attending: Family Medicine | Admitting: Family Medicine

## 2024-09-18 DIAGNOSIS — Z1231 Encounter for screening mammogram for malignant neoplasm of breast: Secondary | ICD-10-CM | POA: Diagnosis present
